# Patient Record
Sex: Male | Born: 1990 | Race: Black or African American | Hispanic: No | Marital: Single | Smoking: Current every day smoker
Health system: Southern US, Community
[De-identification: ages and names within clinical notes are randomized; demographics above are authoritative.]

## PROBLEM LIST (undated history)

## (undated) DIAGNOSIS — S73015A Posterior dislocation of left hip, initial encounter: Secondary | ICD-10-CM

## (undated) DIAGNOSIS — E349 Endocrine disorder, unspecified: Secondary | ICD-10-CM

## (undated) DIAGNOSIS — M238X2 Other internal derangements of left knee: Secondary | ICD-10-CM

## (undated) DIAGNOSIS — M62838 Other muscle spasm: Secondary | ICD-10-CM

## (undated) DIAGNOSIS — M238X1 Other internal derangements of right knee: Secondary | ICD-10-CM

## (undated) DIAGNOSIS — S82002B Unspecified fracture of left patella, initial encounter for open fracture type I or II: Secondary | ICD-10-CM

## (undated) DIAGNOSIS — E611 Iron deficiency: Secondary | ICD-10-CM

## (undated) DIAGNOSIS — S32402A Unspecified fracture of left acetabulum, initial encounter for closed fracture: Secondary | ICD-10-CM

## (undated) DIAGNOSIS — E559 Vitamin D deficiency, unspecified: Secondary | ICD-10-CM

## (undated) DIAGNOSIS — F1910 Other psychoactive substance abuse, uncomplicated: Secondary | ICD-10-CM

## (undated) DIAGNOSIS — S6291XB Unspecified fracture of right wrist and hand, initial encounter for open fracture: Secondary | ICD-10-CM

---

## 2014-04-03 ENCOUNTER — Emergency Department (HOSPITAL_COMMUNITY): Payer: BC Managed Care – PPO

## 2014-04-03 ENCOUNTER — Inpatient Hospital Stay (HOSPITAL_COMMUNITY): Payer: BC Managed Care – PPO

## 2014-04-03 ENCOUNTER — Inpatient Hospital Stay (HOSPITAL_COMMUNITY)
Admission: EM | Admit: 2014-04-03 | Discharge: 2014-04-11 | DRG: 481 | Disposition: A | Discharge status: Home / Self Care | Payer: BC Managed Care – PPO | Attending: Orthopedic Surgery | Admitting: Orthopedic Surgery

## 2014-04-03 ENCOUNTER — Encounter (HOSPITAL_COMMUNITY): Payer: Self-pay | Admitting: Emergency Medicine

## 2014-04-03 DIAGNOSIS — E559 Vitamin D deficiency, unspecified: Secondary | ICD-10-CM | POA: Diagnosis present

## 2014-04-03 DIAGNOSIS — S91009A Unspecified open wound, unspecified ankle, initial encounter: Secondary | ICD-10-CM

## 2014-04-03 DIAGNOSIS — F1010 Alcohol abuse, uncomplicated: Secondary | ICD-10-CM | POA: Diagnosis present

## 2014-04-03 DIAGNOSIS — S73006A Unspecified dislocation of unspecified hip, initial encounter: Secondary | ICD-10-CM | POA: Diagnosis present

## 2014-04-03 DIAGNOSIS — H9210 Otorrhea, unspecified ear: Secondary | ICD-10-CM | POA: Diagnosis present

## 2014-04-03 DIAGNOSIS — F17200 Nicotine dependence, unspecified, uncomplicated: Secondary | ICD-10-CM | POA: Diagnosis present

## 2014-04-03 DIAGNOSIS — D62 Acute posthemorrhagic anemia: Secondary | ICD-10-CM | POA: Diagnosis not present

## 2014-04-03 DIAGNOSIS — M545 Low back pain, unspecified: Secondary | ICD-10-CM | POA: Diagnosis present

## 2014-04-03 DIAGNOSIS — E291 Testicular hypofunction: Secondary | ICD-10-CM | POA: Diagnosis present

## 2014-04-03 DIAGNOSIS — M25469 Effusion, unspecified knee: Secondary | ICD-10-CM | POA: Diagnosis present

## 2014-04-03 DIAGNOSIS — S32409A Unspecified fracture of unspecified acetabulum, initial encounter for closed fracture: Secondary | ICD-10-CM | POA: Diagnosis present

## 2014-04-03 DIAGNOSIS — E611 Iron deficiency: Secondary | ICD-10-CM | POA: Diagnosis present

## 2014-04-03 DIAGNOSIS — F1210 Cannabis abuse, uncomplicated: Secondary | ICD-10-CM | POA: Diagnosis present

## 2014-04-03 DIAGNOSIS — E349 Endocrine disorder, unspecified: Secondary | ICD-10-CM | POA: Diagnosis present

## 2014-04-03 DIAGNOSIS — S02109A Fracture of base of skull, unspecified side, initial encounter for closed fracture: Secondary | ICD-10-CM | POA: Diagnosis present

## 2014-04-03 DIAGNOSIS — M238X2 Other internal derangements of left knee: Secondary | ICD-10-CM | POA: Diagnosis present

## 2014-04-03 DIAGNOSIS — S83106A Unspecified dislocation of unspecified knee, initial encounter: Secondary | ICD-10-CM | POA: Diagnosis present

## 2014-04-03 DIAGNOSIS — S73015A Posterior dislocation of left hip, initial encounter: Secondary | ICD-10-CM | POA: Diagnosis present

## 2014-04-03 DIAGNOSIS — F1910 Other psychoactive substance abuse, uncomplicated: Secondary | ICD-10-CM | POA: Diagnosis present

## 2014-04-03 DIAGNOSIS — M238X1 Other internal derangements of right knee: Secondary | ICD-10-CM | POA: Diagnosis present

## 2014-04-03 DIAGNOSIS — S82002B Unspecified fracture of left patella, initial encounter for open fracture type I or II: Secondary | ICD-10-CM | POA: Diagnosis present

## 2014-04-03 DIAGNOSIS — S62309A Unspecified fracture of unspecified metacarpal bone, initial encounter for closed fracture: Secondary | ICD-10-CM | POA: Diagnosis present

## 2014-04-03 DIAGNOSIS — S6291XB Unspecified fracture of right wrist and hand, initial encounter for open fracture: Secondary | ICD-10-CM | POA: Diagnosis present

## 2014-04-03 DIAGNOSIS — M25069 Hemarthrosis, unspecified knee: Secondary | ICD-10-CM | POA: Diagnosis present

## 2014-04-03 DIAGNOSIS — S72009A Fracture of unspecified part of neck of unspecified femur, initial encounter for closed fracture: Secondary | ICD-10-CM | POA: Diagnosis present

## 2014-04-03 DIAGNOSIS — S0219XA Other fracture of base of skull, initial encounter for closed fracture: Secondary | ICD-10-CM | POA: Diagnosis present

## 2014-04-03 DIAGNOSIS — S81809A Unspecified open wound, unspecified lower leg, initial encounter: Secondary | ICD-10-CM

## 2014-04-03 DIAGNOSIS — S82009A Unspecified fracture of unspecified patella, initial encounter for closed fracture: Secondary | ICD-10-CM | POA: Diagnosis present

## 2014-04-03 DIAGNOSIS — S81009A Unspecified open wound, unspecified knee, initial encounter: Secondary | ICD-10-CM | POA: Diagnosis present

## 2014-04-03 DIAGNOSIS — S32402A Unspecified fracture of left acetabulum, initial encounter for closed fracture: Secondary | ICD-10-CM

## 2014-04-03 HISTORY — DX: Posterior dislocation of left hip, initial encounter: S73.015A

## 2014-04-03 HISTORY — DX: Iron deficiency: E61.1

## 2014-04-03 HISTORY — DX: Unspecified fracture of right wrist and hand, initial encounter for open fracture: S62.91XB

## 2014-04-03 HISTORY — DX: Other internal derangements of left knee: M23.8X2

## 2014-04-03 HISTORY — DX: Unspecified fracture of left acetabulum, initial encounter for closed fracture: S32.402A

## 2014-04-03 HISTORY — DX: Other internal derangements of right knee: M23.8X1

## 2014-04-03 HISTORY — DX: Other muscle spasm: M62.838

## 2014-04-03 HISTORY — DX: Unspecified fracture of left patella, initial encounter for open fracture type I or II: S82.002B

## 2014-04-03 HISTORY — DX: Vitamin D deficiency, unspecified: E55.9

## 2014-04-03 HISTORY — DX: Other psychoactive substance abuse, uncomplicated: F19.10

## 2014-04-03 HISTORY — DX: Endocrine disorder, unspecified: E34.9

## 2014-04-03 LAB — I-STAT CHEM 8, ED
BUN: 13 mg/dL (ref 6–23)
CALCIUM ION: 1.14 mmol/L (ref 1.12–1.23)
Chloride: 111 mEq/L (ref 96–112)
Creatinine, Ser: 1.4 mg/dL — ABNORMAL HIGH (ref 0.50–1.35)
Glucose, Bld: 162 mg/dL — ABNORMAL HIGH (ref 70–99)
HCT: 44 % (ref 39.0–52.0)
Hemoglobin: 15 g/dL (ref 13.0–17.0)
Potassium: 3.4 mEq/L — ABNORMAL LOW (ref 3.7–5.3)
Sodium: 142 mEq/L (ref 137–147)
TCO2: 18 mmol/L (ref 0–100)

## 2014-04-03 LAB — ABO/RH: ABO/RH(D): A POS

## 2014-04-03 LAB — CBC WITH DIFFERENTIAL/PLATELET
BASOS ABS: 0 10*3/uL (ref 0.0–0.1)
Basophils Absolute: 0 10*3/uL (ref 0.0–0.1)
Basophils Relative: 0 % (ref 0–1)
Basophils Relative: 0 % (ref 0–1)
EOS ABS: 0 10*3/uL (ref 0.0–0.7)
EOS PCT: 0 % (ref 0–5)
EOS PCT: 0 % (ref 0–5)
Eosinophils Absolute: 0 10*3/uL (ref 0.0–0.7)
HCT: 39.1 % (ref 39.0–52.0)
HEMATOCRIT: 33.2 % — AB (ref 39.0–52.0)
HEMOGLOBIN: 13.6 g/dL (ref 13.0–17.0)
Hemoglobin: 11.5 g/dL — ABNORMAL LOW (ref 13.0–17.0)
Lymphocytes Relative: 13 % (ref 12–46)
Lymphocytes Relative: 12 % (ref 12–46)
Lymphs Abs: 2 10*3/uL (ref 0.7–4.0)
Lymphs Abs: 1.6 10*3/uL (ref 0.7–4.0)
MCH: 30.4 pg (ref 26.0–34.0)
MCH: 30.2 pg (ref 26.0–34.0)
MCHC: 34.8 g/dL (ref 30.0–36.0)
MCHC: 34.6 g/dL (ref 30.0–36.0)
MCV: 87.5 fL (ref 78.0–100.0)
MCV: 87.1 fL (ref 78.0–100.0)
MONO ABS: 2.3 10*3/uL — AB (ref 0.1–1.0)
MONOS PCT: 5 % (ref 3–12)
Monocytes Absolute: 0.7 10*3/uL (ref 0.1–1.0)
Monocytes Relative: 18 % — ABNORMAL HIGH (ref 3–12)
Neutro Abs: 12.6 10*3/uL — ABNORMAL HIGH (ref 1.7–7.7)
Neutro Abs: 9.1 10*3/uL — ABNORMAL HIGH (ref 1.7–7.7)
Neutrophils Relative %: 82 % — ABNORMAL HIGH (ref 43–77)
Neutrophils Relative %: 70 % (ref 43–77)
PLATELETS: 246 10*3/uL (ref 150–400)
Platelets: 249 10*3/uL (ref 150–400)
RBC: 4.47 MIL/uL (ref 4.22–5.81)
RBC: 3.81 MIL/uL — ABNORMAL LOW (ref 4.22–5.81)
RDW: 12.7 % (ref 11.5–15.5)
RDW: 12.9 % (ref 11.5–15.5)
WBC: 15.4 10*3/uL — ABNORMAL HIGH (ref 4.0–10.5)
WBC: 12.9 10*3/uL — ABNORMAL HIGH (ref 4.0–10.5)

## 2014-04-03 LAB — URINALYSIS, ROUTINE W REFLEX MICROSCOPIC
Bilirubin Urine: NEGATIVE
Bilirubin Urine: NEGATIVE
GLUCOSE, UA: NEGATIVE mg/dL
Glucose, UA: NEGATIVE mg/dL
KETONES UR: NEGATIVE mg/dL
KETONES UR: 15 mg/dL — AB
LEUKOCYTES UA: NEGATIVE
Leukocytes, UA: NEGATIVE
NITRITE: NEGATIVE
Nitrite: NEGATIVE
PROTEIN: 100 mg/dL — AB
PROTEIN: NEGATIVE mg/dL
Specific Gravity, Urine: 1.014 (ref 1.005–1.030)
Specific Gravity, Urine: 1.018 (ref 1.005–1.030)
UROBILINOGEN UA: 0.2 mg/dL (ref 0.0–1.0)
Urobilinogen, UA: 0.2 mg/dL (ref 0.0–1.0)
pH: 6 (ref 5.0–8.0)
pH: 6 (ref 5.0–8.0)

## 2014-04-03 LAB — COMPREHENSIVE METABOLIC PANEL
ALT: 30 U/L (ref 0–53)
ALT: 36 U/L (ref 0–53)
ANION GAP: 20 — AB (ref 5–15)
AST: 56 U/L — ABNORMAL HIGH (ref 0–37)
AST: 95 U/L — AB (ref 0–37)
Albumin: 4.2 g/dL (ref 3.5–5.2)
Albumin: 4 g/dL (ref 3.5–5.2)
Alkaline Phosphatase: 69 U/L (ref 39–117)
Alkaline Phosphatase: 67 U/L (ref 39–117)
Anion gap: 14 (ref 5–15)
BUN: 13 mg/dL (ref 6–23)
BUN: 9 mg/dL (ref 6–23)
CALCIUM: 9.5 mg/dL (ref 8.4–10.5)
CALCIUM: 9.2 mg/dL (ref 8.4–10.5)
CO2: 17 mEq/L — ABNORMAL LOW (ref 19–32)
CO2: 24 mEq/L (ref 19–32)
CREATININE: 1.13 mg/dL (ref 0.50–1.35)
CREATININE: 0.85 mg/dL (ref 0.50–1.35)
Chloride: 105 mEq/L (ref 96–112)
Chloride: 101 mEq/L (ref 96–112)
GFR calc Af Amer: >90 mL/min (ref >90)
GFR calc non Af Amer: 90 mL/min — ABNORMAL LOW (ref >90)
GFR calc non Af Amer: >90 mL/min (ref >90)
Glucose, Bld: 160 mg/dL — ABNORMAL HIGH (ref 70–99)
Glucose, Bld: 97 mg/dL (ref 70–99)
Potassium: 3.7 mEq/L (ref 3.7–5.3)
Potassium: 4 mEq/L (ref 3.7–5.3)
Sodium: 142 mEq/L (ref 137–147)
Sodium: 139 mEq/L (ref 137–147)
TOTAL PROTEIN: 7.7 g/dL (ref 6.0–8.3)
Total Bilirubin: 0.5 mg/dL (ref 0.3–1.2)
Total Bilirubin: 1.7 mg/dL — ABNORMAL HIGH (ref 0.3–1.2)
Total Protein: 7.1 g/dL (ref 6.0–8.3)

## 2014-04-03 LAB — URINE MICROSCOPIC-ADD ON

## 2014-04-03 LAB — ETHANOL: Alcohol, Ethyl (B): 202 mg/dL — ABNORMAL HIGH (ref 0–11)

## 2014-04-03 LAB — RAPID URINE DRUG SCREEN, HOSP PERFORMED
Amphetamines: NOT DETECTED
Barbiturates: NOT DETECTED
Benzodiazepines: NOT DETECTED
Cocaine: NOT DETECTED
OPIATES: NOT DETECTED
Tetrahydrocannabinol: POSITIVE — AB

## 2014-04-03 LAB — APTT: APTT: 32 s (ref 24–37)

## 2014-04-03 LAB — TYPE AND SCREEN
ABO/RH(D): A POS
ANTIBODY SCREEN: NEGATIVE

## 2014-04-03 LAB — I-STAT CG4 LACTIC ACID, ED: LACTIC ACID, VENOUS: 4.48 mmol/L — AB (ref 0.5–2.2)

## 2014-04-03 LAB — PROTIME-INR
INR: 1.23 (ref 0.00–1.49)
Prothrombin Time: 15.5 seconds — ABNORMAL HIGH (ref 11.6–15.2)

## 2014-04-03 LAB — LACTIC ACID, PLASMA: Lactic Acid, Venous: 2.1 mmol/L (ref 0.5–2.2)

## 2014-04-03 MED ORDER — SODIUM CHLORIDE 0.9 % IV BOLUS (SEPSIS)
1000.0000 mL | Freq: Once | INTRAVENOUS | Status: AC
  Administered 2014-04-03: 1000 mL via INTRAVENOUS

## 2014-04-03 MED ORDER — KETAMINE HCL 10 MG/ML IJ SOLN
160.0000 mg | Freq: Once | INTRAMUSCULAR | Status: AC
  Administered 2014-04-03: 90 mg via INTRAVENOUS
  Filled 2014-04-03: qty 16

## 2014-04-03 MED ORDER — FENTANYL CITRATE 0.05 MG/ML IJ SOLN
INTRAMUSCULAR | Status: AC
  Administered 2014-04-03: 100 ug
  Filled 2014-04-03: qty 2

## 2014-04-03 MED ORDER — ONDANSETRON HCL 4 MG PO TABS: 4.0000 mg | ORAL_TABLET | Freq: Three times a day (TID) | ORAL | Status: DC | PRN

## 2014-04-03 MED ORDER — MAGNESIUM CITRATE PO SOLN: 1.0000 | Freq: Once | ORAL | Status: AC | PRN

## 2014-04-03 MED ORDER — KETAMINE HCL 10 MG/ML IJ SOLN
INTRAMUSCULAR | Status: AC | PRN
  Administered 2014-04-03: 50 mg via INTRAVENOUS

## 2014-04-03 MED ORDER — POLYETHYLENE GLYCOL 3350 17 G PO PACK
17.0000 g | PACK | Freq: Every day | ORAL | Status: DC | PRN
  Filled 2014-04-03: qty 1

## 2014-04-03 MED ORDER — MORPHINE SULFATE 2 MG/ML IJ SOLN
0.5000 mg | INTRAMUSCULAR | Status: DC | PRN
  Administered 2014-04-03: 0.5 mg via INTRAVENOUS
  Filled 2014-04-03: qty 1

## 2014-04-03 MED ORDER — SODIUM CHLORIDE 0.9 % IV SOLN
INTRAVENOUS | Status: DC
  Administered 2014-04-03 – 2014-04-10 (×8): via INTRAVENOUS

## 2014-04-03 MED ORDER — ONDANSETRON HCL 4 MG/2ML IJ SOLN
4.0000 mg | Freq: Once | INTRAMUSCULAR | Status: AC
  Administered 2014-04-03: 4 mg via INTRAVENOUS
  Filled 2014-04-03: qty 2

## 2014-04-03 MED ORDER — FENTANYL CITRATE 0.05 MG/ML IJ SOLN
INTRAMUSCULAR | Status: AC
  Administered 2014-04-03: 100 ug
  Filled 2014-04-03: qty 4

## 2014-04-03 MED ORDER — DEXTROSE 5 % IV SOLN
500.0000 mg | Freq: Four times a day (QID) | INTRAVENOUS | Status: DC | PRN
  Filled 2014-04-03: qty 5

## 2014-04-03 MED ORDER — ONDANSETRON HCL 4 MG/2ML IJ SOLN
4.0000 mg | Freq: Four times a day (QID) | INTRAMUSCULAR | Status: DC | PRN
  Administered 2014-04-03: 4 mg via INTRAVENOUS
  Filled 2014-04-03: qty 2

## 2014-04-03 MED ORDER — SODIUM BICARBONATE 4 % IV SOLN
5.0000 mL | Freq: Once | INTRAVENOUS | Status: AC
  Administered 2014-04-03: 5 mL via INTRAVENOUS
  Filled 2014-04-03: qty 5

## 2014-04-03 MED ORDER — HYDROCODONE-ACETAMINOPHEN 5-325 MG PO TABS
1.0000 | ORAL_TABLET | Freq: Four times a day (QID) | ORAL | Status: DC | PRN
  Administered 2014-04-03: 2 via ORAL
  Filled 2014-04-03: qty 2

## 2014-04-03 MED ORDER — METHOCARBAMOL 500 MG PO TABS
500.0000 mg | ORAL_TABLET | Freq: Four times a day (QID) | ORAL | Status: DC | PRN
  Administered 2014-04-03: 500 mg via ORAL
  Filled 2014-04-03: qty 1

## 2014-04-03 MED ORDER — SORBITOL 70 % SOLN
30.0000 mL | Freq: Every day | Status: DC | PRN
  Filled 2014-04-03: qty 30

## 2014-04-03 MED ORDER — OXYCODONE HCL 5 MG PO TABS
5.0000 mg | ORAL_TABLET | ORAL | Status: DC | PRN
  Administered 2014-04-03 – 2014-04-04 (×5): 10 mg via ORAL
  Filled 2014-04-03 (×6): qty 2

## 2014-04-03 MED ORDER — LIDOCAINE-EPINEPHRINE 1 %-1:100000 IJ SOLN
20.0000 mL | Freq: Once | INTRAMUSCULAR | Status: AC
Start: 2014-04-03 — End: 2014-04-03
  Administered 2014-04-03: 20 mL
  Filled 2014-04-03: qty 1

## 2014-04-03 MED ORDER — DIAZEPAM 5 MG PO TABS
5.0000 mg | ORAL_TABLET | Freq: Four times a day (QID) | ORAL | Status: DC | PRN
  Administered 2014-04-03 – 2014-04-10 (×9): 5 mg via ORAL
  Filled 2014-04-03 (×9): qty 1

## 2014-04-03 MED ORDER — FENTANYL CITRATE 0.05 MG/ML IJ SOLN
100.0000 ug | Freq: Once | INTRAMUSCULAR | Status: AC
  Administered 2014-04-03: 100 ug via INTRAVENOUS

## 2014-04-03 MED ORDER — TETANUS-DIPHTH-ACELL PERTUSSIS 5-2.5-18.5 LF-MCG/0.5 IM SUSP
0.5000 mL | Freq: Once | INTRAMUSCULAR | Status: AC
  Administered 2014-04-03: 0.5 mL via INTRAMUSCULAR

## 2014-04-03 MED ORDER — SENNA 8.6 MG PO TABS
1.0000 | ORAL_TABLET | Freq: Two times a day (BID) | ORAL | Status: DC
  Administered 2014-04-03 – 2014-04-11 (×14): 8.6 mg via ORAL
  Filled 2014-04-03 (×19): qty 1

## 2014-04-03 MED ORDER — CIPROFLOXACIN-DEXAMETHASONE 0.3-0.1 % OT SUSP
4.0000 [drp] | Freq: Two times a day (BID) | OTIC | Status: DC
Start: 2014-04-03 — End: 2014-04-11
  Administered 2014-04-03 – 2014-04-11 (×17): 4 [drp] via OTIC
  Filled 2014-04-03 (×3): qty 7.5

## 2014-04-03 MED ORDER — CEFAZOLIN SODIUM-DEXTROSE 2-3 GM-% IV SOLR
INTRAVENOUS | Status: AC
  Administered 2014-04-03: 2000 mg
  Filled 2014-04-03: qty 50

## 2014-04-03 MED ORDER — TETANUS-DIPHTH-ACELL PERTUSSIS 5-2.5-18.5 LF-MCG/0.5 IM SUSP
INTRAMUSCULAR | Status: AC
  Filled 2014-04-03: qty 0.5

## 2014-04-03 MED ORDER — ONDANSETRON HCL 4 MG/2ML IJ SOLN
4.0000 mg | Freq: Once | INTRAMUSCULAR | Status: AC
  Administered 2014-04-03: 4 mg via INTRAVENOUS

## 2014-04-03 MED ORDER — HYDROMORPHONE HCL PF 1 MG/ML IJ SOLN
1.0000 mg | Freq: Once | INTRAMUSCULAR | Status: AC
  Administered 2014-04-03: 1 mg via INTRAVENOUS
  Filled 2014-04-03: qty 1

## 2014-04-03 MED ORDER — MORPHINE SULFATE 2 MG/ML IJ SOLN
2.0000 mg | INTRAMUSCULAR | Status: DC | PRN
  Administered 2014-04-03 – 2014-04-04 (×5): 2 mg via INTRAVENOUS
  Filled 2014-04-03 (×5): qty 1

## 2014-04-03 NOTE — Progress Notes (Signed)
Chaplain responded to page. Patient was unavailable. Chaplain communicated with staff. Staff will inform chaplain if family needs her services.   04/03/14 0300  Clinical Encounter Type  Visited With Patient not available  Visit Type Critical Care;Initial  Referral From Nurse  Charmian Muff, Chaplain 3:55 AM 04/03/2014

## 2014-04-03 NOTE — H&P (Signed)
See consult note

## 2014-04-03 NOTE — Progress Notes (Signed)
Dr. Carola Frost has agreed to take over care.
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
## 2014-04-03 NOTE — Progress Notes (Signed)
Orthopedic Tech Progress Note Patient Details:  Curtis [name] October 16, 1990 [id number]  Ortho Devices Type of Ortho Device: Knee Immobilizer Ortho Device/Splint Location: LLE Ortho Device/Splint Interventions: Application   Asia R Thompson 04/03/2014, 9:05 AM
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
## 2014-04-03 NOTE — ED Notes (Signed)
Pt transported to CT/XR 

## 2014-04-03 NOTE — Progress Notes (Signed)
Orthopedic Tech Progress Note Patient Details:  Curtis [name] [birth year] [id number]  Musculoskeletal Traction Type of Traction: Skeletal (Balanced Suspension) Traction Location: LLE Traction Weight: 20 lbs    [place] 04/03/2014, 9:05 AM

## 2014-04-03 NOTE — Progress Notes (Signed)
Orthopedic Tech Progress Note Patient Details:  Curtis [name] 02/03/1991 [id number] OHF applied to bed Patient ID: Curtis [name], male   DOB: March 23, 1991, 23 y.o.   MRN: [id number]   Curtis [name] 04/03/2014, 12:38 PM
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
## 2014-04-03 NOTE — ED Notes (Signed)
Pt returned from CT/XR. Family at bedside
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
## 2014-04-03 NOTE — Progress Notes (Signed)
Patient refused foley catheter MD aware

## 2014-04-04 ENCOUNTER — Inpatient Hospital Stay (HOSPITAL_COMMUNITY): Payer: BC Managed Care – PPO

## 2014-04-04 ENCOUNTER — Encounter (HOSPITAL_COMMUNITY): Payer: BC Managed Care – PPO | Admitting: Anesthesiology

## 2014-04-04 ENCOUNTER — Encounter (HOSPITAL_COMMUNITY)
Admission: EM | Disposition: A | Discharge status: Home / Self Care | Payer: Self-pay | Source: Home / Self Care | Attending: Orthopedic Surgery

## 2014-04-04 ENCOUNTER — Encounter (HOSPITAL_COMMUNITY): Payer: Self-pay | Admitting: Orthopedic Surgery

## 2014-04-04 ENCOUNTER — Inpatient Hospital Stay (HOSPITAL_COMMUNITY): Payer: BC Managed Care – PPO | Admitting: Anesthesiology

## 2014-04-04 HISTORY — PX: ORIF ACETABULAR FRACTURE: SHX5029

## 2014-04-04 HISTORY — PX: ORIF PATELLA: SHX5033

## 2014-04-04 LAB — CBC
HCT: 37.7 % — ABNORMAL LOW (ref 39.0–52.0)
Hemoglobin: 12.8 g/dL — ABNORMAL LOW (ref 13.0–17.0)
MCH: 30 pg (ref 26.0–34.0)
MCHC: 34 g/dL (ref 30.0–36.0)
MCV: 88.3 fL (ref 78.0–100.0)
Platelets: 202 10*3/uL (ref 150–400)
RBC: 4.27 MIL/uL (ref 4.22–5.81)
RDW: 12.9 % (ref 11.5–15.5)
WBC: 13.7 10*3/uL — ABNORMAL HIGH (ref 4.0–10.5)

## 2014-04-04 LAB — BASIC METABOLIC PANEL
Anion gap: 12 (ref 5–15)
BUN: 7 mg/dL (ref 6–23)
CALCIUM: 9.4 mg/dL (ref 8.4–10.5)
CO2: 25 mEq/L (ref 19–32)
Chloride: 101 mEq/L (ref 96–112)
Creatinine, Ser: 0.88 mg/dL (ref 0.50–1.35)
Glucose, Bld: 96 mg/dL (ref 70–99)
POTASSIUM: 4.3 meq/L (ref 3.7–5.3)
Sodium: 138 mEq/L (ref 137–147)

## 2014-04-04 LAB — VITAMIN D 25 HYDROXY (VIT D DEFICIENCY, FRACTURES): VIT D 25 HYDROXY: 17 ng/mL — AB (ref 30–89)

## 2014-04-04 LAB — SURGICAL PCR SCREEN
MRSA, PCR: NEGATIVE
Staphylococcus aureus: NEGATIVE

## 2014-04-04 LAB — LACTIC ACID, PLASMA: Lactic Acid, Venous: 0.9 mmol/L (ref 0.5–2.2)

## 2014-04-04 SURGERY — OPEN REDUCTION INTERNAL FIXATION (ORIF) ACETABULAR FRACTURE
Anesthesia: General | Site: Knee | Laterality: Left

## 2014-04-04 MED ORDER — CEFAZOLIN SODIUM-DEXTROSE 2-3 GM-% IV SOLR
2.0000 g | Freq: Once | INTRAVENOUS | Status: AC
  Administered 2014-04-04: 2 g via INTRAVENOUS
  Filled 2014-04-04: qty 50

## 2014-04-04 MED ORDER — ONDANSETRON HCL 4 MG/2ML IJ SOLN
INTRAMUSCULAR | Status: DC | PRN
  Administered 2014-04-04: 4 mg via INTRAVENOUS

## 2014-04-04 MED ORDER — METHOCARBAMOL 1000 MG/10ML IJ SOLN
500.0000 mg | Freq: Four times a day (QID) | INTRAMUSCULAR | Status: DC | PRN
  Filled 2014-04-04: qty 10

## 2014-04-04 MED ORDER — LIDOCAINE HCL (CARDIAC) 20 MG/ML IV SOLN
INTRAVENOUS | Status: DC | PRN
  Administered 2014-04-04: 50 mg via INTRAVENOUS

## 2014-04-04 MED ORDER — ONDANSETRON HCL 4 MG PO TABS: 4.0000 mg | ORAL_TABLET | Freq: Four times a day (QID) | ORAL | Status: DC | PRN

## 2014-04-04 MED ORDER — METHOCARBAMOL 500 MG PO TABS
500.0000 mg | ORAL_TABLET | Freq: Four times a day (QID) | ORAL | Status: DC | PRN
  Administered 2014-04-04: 500 mg via ORAL
  Filled 2014-04-04 (×2): qty 1

## 2014-04-04 MED ORDER — HYDROMORPHONE HCL PF 1 MG/ML IJ SOLN
0.5000 mg | INTRAMUSCULAR | Status: DC | PRN
  Administered 2014-04-05 – 2014-04-07 (×4): 1 mg via INTRAVENOUS
  Filled 2014-04-04 (×2): qty 1

## 2014-04-04 MED ORDER — LACTATED RINGERS IV SOLN
INTRAVENOUS | Status: DC | PRN
  Administered 2014-04-04 (×3): via INTRAVENOUS

## 2014-04-04 MED ORDER — PROPOFOL 10 MG/ML IV BOLUS
INTRAVENOUS | Status: AC
  Filled 2014-04-04: qty 20

## 2014-04-04 MED ORDER — DIPHENHYDRAMINE HCL 12.5 MG/5ML PO ELIX: 12.5000 mg | ORAL_SOLUTION | Freq: Four times a day (QID) | ORAL | Status: DC | PRN

## 2014-04-04 MED ORDER — SODIUM CHLORIDE 0.9 % IJ SOLN: 9.0000 mL | INTRAMUSCULAR | Status: DC | PRN

## 2014-04-04 MED ORDER — 0.9 % SODIUM CHLORIDE (POUR BTL) OPTIME
TOPICAL | Status: DC | PRN
  Administered 2014-04-04: 1000 mL

## 2014-04-04 MED ORDER — METHOCARBAMOL 500 MG PO TABS
500.0000 mg | ORAL_TABLET | Freq: Four times a day (QID) | ORAL | Status: DC | PRN
  Administered 2014-04-06: 500 mg via ORAL
  Administered 2014-04-07: 1000 mg via ORAL
  Filled 2014-04-04: qty 2

## 2014-04-04 MED ORDER — CEFAZOLIN SODIUM-DEXTROSE 2-3 GM-% IV SOLR
INTRAVENOUS | Status: AC
  Filled 2014-04-04: qty 50

## 2014-04-04 MED ORDER — METOCLOPRAMIDE HCL 5 MG/ML IJ SOLN: 5.0000 mg | Freq: Three times a day (TID) | INTRAMUSCULAR | Status: DC | PRN

## 2014-04-04 MED ORDER — SUFENTANIL CITRATE 50 MCG/ML IV SOLN
INTRAVENOUS | Status: AC
  Filled 2014-04-04: qty 1

## 2014-04-04 MED ORDER — ROCURONIUM BROMIDE 100 MG/10ML IV SOLN
INTRAVENOUS | Status: DC | PRN
  Administered 2014-04-04: 50 mg via INTRAVENOUS
  Administered 2014-04-04: 10 mg via INTRAVENOUS
  Administered 2014-04-04: 20 mg via INTRAVENOUS

## 2014-04-04 MED ORDER — METOCLOPRAMIDE HCL 10 MG PO TABS
5.0000 mg | ORAL_TABLET | Freq: Three times a day (TID) | ORAL | Status: DC | PRN
  Administered 2014-04-10: 10 mg via ORAL
  Filled 2014-04-04: qty 1

## 2014-04-04 MED ORDER — MIDAZOLAM HCL 2 MG/2ML IJ SOLN
INTRAMUSCULAR | Status: AC
  Filled 2014-04-04: qty 2

## 2014-04-04 MED ORDER — WARFARIN SODIUM 10 MG PO TABS
10.0000 mg | ORAL_TABLET | Freq: Once | ORAL | Status: AC
  Administered 2014-04-04: 10 mg via ORAL
  Filled 2014-04-04: qty 1

## 2014-04-04 MED ORDER — OXYCODONE HCL 5 MG PO TABS: 5.0000 mg | ORAL_TABLET | ORAL | Status: DC | PRN

## 2014-04-04 MED ORDER — PHENYLEPHRINE HCL 10 MG/ML IJ SOLN
INTRAMUSCULAR | Status: DC | PRN
  Administered 2014-04-04: 40 ug via INTRAVENOUS

## 2014-04-04 MED ORDER — MAGNESIUM CITRATE PO SOLN: 1.0000 | Freq: Once | ORAL | Status: AC | PRN

## 2014-04-04 MED ORDER — PROPOFOL 10 MG/ML IV BOLUS
INTRAVENOUS | Status: DC | PRN
  Administered 2014-04-04: 200 mg via INTRAVENOUS

## 2014-04-04 MED ORDER — DIPHENHYDRAMINE HCL 50 MG/ML IJ SOLN
12.5000 mg | Freq: Four times a day (QID) | INTRAMUSCULAR | Status: DC | PRN
  Administered 2014-04-05: 12.5 mg via INTRAVENOUS
  Filled 2014-04-04: qty 1

## 2014-04-04 MED ORDER — ROCURONIUM BROMIDE 100 MG/10ML IV SOLN: INTRAVENOUS | Status: DC | PRN

## 2014-04-04 MED ORDER — NEOSTIGMINE METHYLSULFATE 10 MG/10ML IV SOLN
INTRAVENOUS | Status: DC | PRN
  Administered 2014-04-04: 3 mg via INTRAVENOUS

## 2014-04-04 MED ORDER — ONDANSETRON HCL 4 MG/2ML IJ SOLN
INTRAMUSCULAR | Status: AC
  Filled 2014-04-04: qty 2

## 2014-04-04 MED ORDER — ROCURONIUM BROMIDE 50 MG/5ML IV SOLN
INTRAVENOUS | Status: AC
  Filled 2014-04-04: qty 1

## 2014-04-04 MED ORDER — ENOXAPARIN SODIUM 40 MG/0.4ML ~~LOC~~ SOLN
40.0000 mg | SUBCUTANEOUS | Status: DC
  Administered 2014-04-05 – 2014-04-08 (×3): 40 mg via SUBCUTANEOUS
  Filled 2014-04-04 (×6): qty 0.4

## 2014-04-04 MED ORDER — ONDANSETRON HCL 4 MG/2ML IJ SOLN: 4.0000 mg | Freq: Four times a day (QID) | INTRAMUSCULAR | Status: DC | PRN

## 2014-04-04 MED ORDER — ACETAMINOPHEN 10 MG/ML IV SOLN
1000.0000 mg | Freq: Four times a day (QID) | INTRAVENOUS | Status: DC
  Administered 2014-04-04: 1000 mg via INTRAVENOUS
  Filled 2014-04-04: qty 100

## 2014-04-04 MED ORDER — LIDOCAINE HCL (CARDIAC) 20 MG/ML IV SOLN: INTRAVENOUS | Status: DC | PRN

## 2014-04-04 MED ORDER — NEOSTIGMINE METHYLSULFATE 10 MG/10ML IV SOLN
INTRAVENOUS | Status: AC
  Filled 2014-04-04: qty 1

## 2014-04-04 MED ORDER — HYDROMORPHONE 0.3 MG/ML IV SOLN
INTRAVENOUS | Status: AC
  Administered 2014-04-04: 19:00:00 via INTRAVENOUS
  Administered 2014-04-05: 1.8 mg via INTRAVENOUS
  Administered 2014-04-05: 3.3 mg via INTRAVENOUS
  Administered 2014-04-05 (×2): via INTRAVENOUS
  Administered 2014-04-05: 2.1 mg via INTRAVENOUS
  Administered 2014-04-06: 04:00:00 via INTRAVENOUS
  Administered 2014-04-06: 2.1 mg via INTRAVENOUS
  Administered 2014-04-06: 2.7 mg via INTRAVENOUS
  Filled 2014-04-04 (×4): qty 25

## 2014-04-04 MED ORDER — DIPHENHYDRAMINE HCL 12.5 MG/5ML PO ELIX
12.5000 mg | ORAL_SOLUTION | ORAL | Status: DC | PRN
  Administered 2014-04-06 – 2014-04-11 (×11): 25 mg via ORAL
  Filled 2014-04-04 (×10): qty 10

## 2014-04-04 MED ORDER — NALOXONE HCL 0.4 MG/ML IJ SOLN: 0.4000 mg | INTRAMUSCULAR | Status: DC | PRN

## 2014-04-04 MED ORDER — CEFAZOLIN SODIUM 1-5 GM-% IV SOLN
1.0000 g | Freq: Four times a day (QID) | INTRAVENOUS | Status: AC
  Administered 2014-04-04 – 2014-04-05 (×3): 1 g via INTRAVENOUS
  Filled 2014-04-04 (×3): qty 50

## 2014-04-04 MED ORDER — CEFAZOLIN SODIUM-DEXTROSE 2-3 GM-% IV SOLR
INTRAVENOUS | Status: DC | PRN
  Administered 2014-04-04 (×2): 2 g via INTRAVENOUS

## 2014-04-04 MED ORDER — HYDROMORPHONE HCL PF 1 MG/ML IJ SOLN
0.5000 mg | INTRAMUSCULAR | Status: DC | PRN
  Administered 2014-04-04 (×2): 1 mg via INTRAVENOUS
  Filled 2014-04-04 (×6): qty 1

## 2014-04-04 MED ORDER — SUFENTANIL CITRATE 50 MCG/ML IV SOLN
INTRAVENOUS | Status: DC | PRN
  Administered 2014-04-04: 10 ug via INTRAVENOUS
  Administered 2014-04-04: 20 ug via INTRAVENOUS

## 2014-04-04 MED ORDER — GLYCOPYRROLATE 0.2 MG/ML IJ SOLN
INTRAMUSCULAR | Status: AC
  Filled 2014-04-04: qty 2

## 2014-04-04 MED ORDER — WARFARIN VIDEO: Freq: Once | Status: DC

## 2014-04-04 MED ORDER — METHOCARBAMOL 1000 MG/10ML IJ SOLN: 500.0000 mg | Freq: Four times a day (QID) | INTRAMUSCULAR | Status: DC | PRN

## 2014-04-04 MED ORDER — GLYCOPYRROLATE 0.2 MG/ML IJ SOLN
INTRAMUSCULAR | Status: DC | PRN
Start: 2014-04-04 — End: 2014-04-04
  Administered 2014-04-04: 0.6 mg via INTRAVENOUS

## 2014-04-04 MED ORDER — PROPOFOL 10 MG/ML IV BOLUS: INTRAVENOUS | Status: DC | PRN

## 2014-04-04 MED ORDER — SODIUM CHLORIDE 0.9 % IR SOLN
Status: DC | PRN
  Administered 2014-04-04: 6000 mL

## 2014-04-04 MED ORDER — DOCUSATE SODIUM 100 MG PO CAPS
100.0000 mg | ORAL_CAPSULE | Freq: Two times a day (BID) | ORAL | Status: DC
  Administered 2014-04-04 – 2014-04-11 (×14): 100 mg via ORAL
  Filled 2014-04-04 (×19): qty 1

## 2014-04-04 MED ORDER — LACTATED RINGERS IV SOLN
INTRAVENOUS | Status: DC
  Administered 2014-04-04: 13:00:00 via INTRAVENOUS

## 2014-04-04 MED ORDER — PATIENT'S GUIDE TO USING COUMADIN BOOK
Freq: Once | Status: AC
  Administered 2014-04-04: 23:00:00
  Filled 2014-04-04: qty 1

## 2014-04-04 MED ORDER — MIDAZOLAM HCL 5 MG/5ML IJ SOLN
INTRAMUSCULAR | Status: DC | PRN
  Administered 2014-04-04: 2 mg via INTRAVENOUS

## 2014-04-04 MED ORDER — POLYETHYLENE GLYCOL 3350 17 G PO PACK
17.0000 g | PACK | Freq: Every day | ORAL | Status: DC
  Administered 2014-04-05 – 2014-04-11 (×6): 17 g via ORAL
  Filled 2014-04-04 (×9): qty 1

## 2014-04-04 MED ORDER — WARFARIN - PHARMACIST DOSING INPATIENT: Freq: Every day | Status: DC

## 2014-04-04 MED ORDER — HYDROMORPHONE 0.3 MG/ML IV SOLN
INTRAVENOUS | Status: AC
  Filled 2014-04-04: qty 25

## 2014-04-04 SURGICAL SUPPLY — 84 items
ANCHOR CORKSCREW BIO 5.5 FT (Anchor) ×4 IMPLANT
APPLIER CLIP 11 MED OPEN (CLIP)
BIT DRILL AO MATTA 2.5MX230M (BIT) ×2 IMPLANT
BIT DRILL AO MATTA 3.2MX180M (BIT) ×2 IMPLANT
BLADE SURG ROTATE 9660 (MISCELLANEOUS) IMPLANT
BRUSH SCRUB DISP (MISCELLANEOUS) ×8 IMPLANT
CLIP APPLIE 11 MED OPEN (CLIP) IMPLANT
CLOSURE WOUND 1/2 X4 (GAUZE/BANDAGES/DRESSINGS)
COVER SURGICAL LIGHT HANDLE (MISCELLANEOUS) ×4 IMPLANT
DRAIN CHANNEL 10F 3/8 F FF (DRAIN) IMPLANT
DRAIN CHANNEL 15F RND FF W/TCR (WOUND CARE) IMPLANT
DRAPE C-ARM 42X72 X-RAY (DRAPES) IMPLANT
DRAPE C-ARMOR (DRAPES) ×4 IMPLANT
DRAPE INCISE IOBAN 66X45 STRL (DRAPES) IMPLANT
DRAPE INCISE IOBAN 85X60 (DRAPES) ×8 IMPLANT
DRAPE ORTHO SPLIT 77X108 STRL (DRAPES) ×4
DRAPE SURG ORHT 6 SPLT 77X108 (DRAPES) ×4 IMPLANT
DRAPE U-SHAPE 47X51 STRL (DRAPES) ×4 IMPLANT
DRILL BIT AO MATTA 2.5MX230M (BIT) ×4
DRILL BIT AO MATTA 3.2MX180M (BIT) ×4
DRSG ADAPTIC 3X8 NADH LF (GAUZE/BANDAGES/DRESSINGS) IMPLANT
DRSG PAD ABDOMINAL 8X10 ST (GAUZE/BANDAGES/DRESSINGS) ×8 IMPLANT
ELECT BLADE 4.0 EZ CLEAN MEGAD (MISCELLANEOUS) ×4
ELECT BLADE 6.5 EXT (BLADE) IMPLANT
ELECT REM PT RETURN 9FT ADLT (ELECTROSURGICAL) ×4
ELECTRODE BLDE 4.0 EZ CLN MEGD (MISCELLANEOUS) ×2 IMPLANT
ELECTRODE REM PT RTRN 9FT ADLT (ELECTROSURGICAL) ×2 IMPLANT
EVACUATOR 1/8 PVC DRAIN (DRAIN) IMPLANT
EVACUATOR SILICONE 100CC (DRAIN) ×4 IMPLANT
GAUZE SPONGE 4X4 12PLY STRL (GAUZE/BANDAGES/DRESSINGS) ×4 IMPLANT
GAUZE SPONGE 4X4 16PLY XRAY LF (GAUZE/BANDAGES/DRESSINGS) ×4 IMPLANT
GLOVE BIO SURGEON STRL SZ7.5 (GLOVE) ×4 IMPLANT
GLOVE BIO SURGEON STRL SZ8 (GLOVE) ×4 IMPLANT
GLOVE BIOGEL PI IND STRL 7.5 (GLOVE) ×2 IMPLANT
GLOVE BIOGEL PI IND STRL 8 (GLOVE) ×2 IMPLANT
GLOVE BIOGEL PI INDICATOR 7.5 (GLOVE) ×2
GLOVE BIOGEL PI INDICATOR 8 (GLOVE) ×2
GOWN STRL REUS W/ TWL LRG LVL3 (GOWN DISPOSABLE) ×4 IMPLANT
GOWN STRL REUS W/ TWL XL LVL3 (GOWN DISPOSABLE) ×2 IMPLANT
GOWN STRL REUS W/TWL 2XL LVL3 (GOWN DISPOSABLE) ×4 IMPLANT
GOWN STRL REUS W/TWL LRG LVL3 (GOWN DISPOSABLE) ×4
GOWN STRL REUS W/TWL XL LVL3 (GOWN DISPOSABLE) ×2
HANDPIECE INTERPULSE COAX TIP (DISPOSABLE)
KIT BASIN OR (CUSTOM PROCEDURE TRAY) ×4 IMPLANT
KIT ROOM TURNOVER OR (KITS) ×4 IMPLANT
LIGHT ORTHO (MISCELLANEOUS) IMPLANT
LOOP VESSEL MAXI BLUE (MISCELLANEOUS) IMPLANT
MANIFOLD NEPTUNE II (INSTRUMENTS) ×4 IMPLANT
NDL SUT 6 .5 CRC .975X.05 MAYO (NEEDLE) ×2 IMPLANT
NEEDLE MAYO TAPER (NEEDLE) ×2
NEEDLE MAYO TROCAR (NEEDLE) ×4 IMPLANT
NS IRRIG 1000ML POUR BTL (IV SOLUTION) ×4 IMPLANT
PACK TOTAL JOINT (CUSTOM PROCEDURE TRAY) ×4 IMPLANT
PAD ARMBOARD 7.5X6 YLW CONV (MISCELLANEOUS) ×8 IMPLANT
PIN 6.0X180MM REDUCTION (PIN) ×4 IMPLANT
PLATE ACET STRT 94.5M 8H (Plate) ×4 IMPLANT
PLATE SPRING 3.5MM 3H STRL (Plate) ×4 IMPLANT
RETRIEVER SUT HEWSON (MISCELLANEOUS) ×4 IMPLANT
SCREW CORT 48X3.5XST NS LF (Screw) ×4 IMPLANT
SCREW CORTEX ST MATTA 3.5X32MM (Screw) ×8 IMPLANT
SCREW CORTEX ST MATTA 3.5X40MM (Screw) ×4 IMPLANT
SCREW CORTEX ST MATTA 3.5X45MM (Screw) ×4 IMPLANT
SCREW CORTICAL 3.5X48MM (Screw) ×4 IMPLANT
SET HNDPC FAN SPRY TIP SCT (DISPOSABLE) IMPLANT
SPONGE LAP 18X18 X RAY DECT (DISPOSABLE) ×12 IMPLANT
STAPLER VISISTAT 35W (STAPLE) ×4 IMPLANT
STRIP CLOSURE SKIN 1/2X4 (GAUZE/BANDAGES/DRESSINGS) IMPLANT
SUCTION FRAZIER TIP 10 FR DISP (SUCTIONS) ×4 IMPLANT
SUT ETHILON 3 0 PS 1 (SUTURE) ×12 IMPLANT
SUT FIBERWIRE #2 38 T-5 BLUE (SUTURE)
SUT PDS AB 0 CT 36 (SUTURE) ×4 IMPLANT
SUT PDS AB 2-0 CT1 27 (SUTURE) ×4 IMPLANT
SUT VIC AB 0 CT1 27 (SUTURE) ×2
SUT VIC AB 0 CT1 27XBRD ANBCTR (SUTURE) ×2 IMPLANT
SUT VIC AB 1 CT1 18XCR BRD 8 (SUTURE) ×2 IMPLANT
SUT VIC AB 1 CT1 8-18 (SUTURE) ×2
SUT VIC AB 2-0 CT1 27 (SUTURE) ×2
SUT VIC AB 2-0 CT1 TAPERPNT 27 (SUTURE) ×2 IMPLANT
SUTURE FIBERWR #2 38 T-5 BLUE (SUTURE) IMPLANT
TIP HIGH FLOW IRRIGATION COAX (MISCELLANEOUS) ×4 IMPLANT
TOWEL OR 17X24 6PK STRL BLUE (TOWEL DISPOSABLE) IMPLANT
TOWEL OR 17X26 10 PK STRL BLUE (TOWEL DISPOSABLE) ×8 IMPLANT
TRAY FOLEY CATH 16FRSI W/METER (SET/KITS/TRAYS/PACK) IMPLANT
WATER STERILE IRR 1000ML POUR (IV SOLUTION) IMPLANT

## 2014-04-04 NOTE — Anesthesia Preprocedure Evaluation (Addendum)
Anesthesia Evaluation  Patient identified by MRN, date of birth, ID band Patient awake    Reviewed: Allergy & Precautions, H&P , NPO status , Patient's Chart, lab work & pertinent test results  Airway       Dental   Pulmonary Current Smoker,          Cardiovascular     Neuro/Psych    GI/Hepatic   Endo/Other    Renal/GU      Musculoskeletal   Abdominal   Peds  Hematology   Anesthesia Other Findings   Reproductive/Obstetrics                          Anesthesia Physical Anesthesia Plan  ASA: II  Anesthesia Plan:    Post-op Pain Management:    Induction:   Airway Management Planned:   Additional Equipment:   Intra-op Plan:   Post-operative Plan:   Informed Consent:   Plan Discussed with:   Anesthesia Plan Comments:         Anesthesia Quick Evaluation
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
## 2014-04-04 NOTE — Brief Op Note (Signed)
04/03/2014 - 04/04/2014  5:45 PM  PATIENT:  Curtis [name]  23 y.o. male  PRE-OPERATIVE DIAGNOSIS:  1. Irreducible left posterior wall acetabular fracture dislocation 2. Left knee wound 3. Right knee effusion  POST-OPERATIVE DIAGNOSIS:  1. Irreducible left posterior wall acetabular fracture dislocation 2. Left open patella fracture 3. Right PCL and probable PLC  PROCEDURE:  Procedure(s) with comments: 1. OPEN REDUCTION INTERNAL FIXATION (ORIF) ACETABULAR FRACTURE (Left) POSTERIOR WALL 2. OPEN TREATMENT OF HIP DISLOCATION WITH LABRAL AND CAPSULAR REPAIR 3. IRRIGATION AND DEBRIDEMENT OF OPEN PATELLA FRACTURE (Left) 4. EXAMINATION UNDER ANESTHESIA OF RIGHT KNEE 5. REMOVAL OF PERCUTANEOUS TRACTION PIN DISTAL FEMUR  SURGEON:  Surgeon(s) and Role:    * Budd Palmer, MD - Primary  PHYSICIAN ASSISTANT: Montez Morita, PA-C  ANESTHESIA:   general  I/O:  Total I/O In: 2000 [I.V.:2000] Out: 1250 [Urine:1150; Blood:100]  SPECIMEN:  No Specimen  TOURNIQUET:  * No tourniquets in log *  DICTATION: .Other Dictation: Dictation Number [id number]
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
## 2014-04-04 NOTE — Transfer of Care (Signed)
Immediate Anesthesia Transfer of Care Note  Patient: Curtis [name]  Procedure(s) Performed: Procedure(s) with comments: OPEN REDUCTION INTERNAL FIXATION (ORIF) ACETABULAR FRACTURE (Left) OPEN REDUCTION INTERNAL (ORIF) FIXATION PATELLA (Left) - dirty wound   Patient Location: PACU  Anesthesia Type:General  Level of Consciousness: awake and alert   Airway & Oxygen Therapy: Patient Spontanous Breathing and Patient connected to nasal cannula oxygen  Post-op Assessment: Report given to PACU RN and Post -op Vital signs reviewed and stable  Post vital signs: Reviewed and stable  Complications: No apparent anesthesia complications
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
## 2014-04-04 NOTE — Consult Note (Signed)
I have seen and examined the patient. I agree with the findings above.  RLE large effusion  LLE reports decreased sensation but intact motor   DP 2+  I discussed with the patient the risks and benefits of surgery for left acetabular fracture, including the possibility of infection, nerve injury, vessel injury, wound breakdown, arthritis, symptomatic hardware, DVT/ PE, avascular necrosis, loss of motion, heterotopic bone, and need for further surgery among others.  He understood these risks and wished to proceed.   Budd Palmer, MD 04/04/2014 1:32 PM
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
## 2014-04-04 NOTE — Progress Notes (Signed)
Pt unable to tolerate moving to place sacral dressing.  Pt is in traction and unable to move due to pain.

## 2014-04-04 NOTE — Anesthesia Procedure Notes (Signed)
Procedure Name: Intubation Date/Time: 04/04/2014 1:48 PM Performed by: Gwenyth Allegra Pre-anesthesia Checklist: Patient identified, Timeout performed, Emergency Drugs available, Suction available and Patient being monitored Patient Re-evaluated:Patient Re-evaluated prior to inductionOxygen Delivery Method: Circle system utilized Preoxygenation: Pre-oxygenation with 100% oxygen Intubation Type: IV induction Ventilation: Mask ventilation without difficulty Laryngoscope Size: Mac and 4 Grade View: Grade I Tube size: 7.5 mm Number of attempts: 1 Placement Confirmation: ETT inserted through vocal cords under direct vision,  breath sounds checked- equal and bilateral and positive ETCO2 Secured at: 22 cm Tube secured with: Tape Dental Injury: Teeth and Oropharynx as per pre-operative assessment

## 2014-04-05 ENCOUNTER — Inpatient Hospital Stay (HOSPITAL_COMMUNITY): Payer: BC Managed Care – PPO

## 2014-04-05 LAB — CBC
HEMATOCRIT: 32.4 % — AB (ref 39.0–52.0)
Hemoglobin: 11.1 g/dL — ABNORMAL LOW (ref 13.0–17.0)
MCH: 30 pg (ref 26.0–34.0)
MCHC: 34.3 g/dL (ref 30.0–36.0)
MCV: 87.6 fL (ref 78.0–100.0)
PLATELETS: 198 10*3/uL (ref 150–400)
RBC: 3.7 MIL/uL — ABNORMAL LOW (ref 4.22–5.81)
RDW: 12.5 % (ref 11.5–15.5)
WBC: 14.8 10*3/uL — ABNORMAL HIGH (ref 4.0–10.5)

## 2014-04-05 LAB — BASIC METABOLIC PANEL
ANION GAP: 12 (ref 5–15)
BUN: 3 mg/dL — ABNORMAL LOW (ref 6–23)
CHLORIDE: 99 meq/L (ref 96–112)
CO2: 25 mEq/L (ref 19–32)
CREATININE: 0.82 mg/dL (ref 0.50–1.35)
Calcium: 8.8 mg/dL (ref 8.4–10.5)
GFR calc Af Amer: >90 mL/min (ref >90)
Glucose, Bld: 136 mg/dL — ABNORMAL HIGH (ref 70–99)
Potassium: 3.8 mEq/L (ref 3.7–5.3)
Sodium: 136 mEq/L — ABNORMAL LOW (ref 137–147)

## 2014-04-05 LAB — HEPATITIS PANEL, ACUTE
HCV Ab: NEGATIVE
HEP A IGM: NONREACTIVE
Hep B C IgM: NONREACTIVE
Hepatitis B Surface Ag: NEGATIVE

## 2014-04-05 LAB — PROTIME-INR
INR: 1.22 (ref 0.00–1.49)
Prothrombin Time: 15.4 seconds — ABNORMAL HIGH (ref 11.6–15.2)

## 2014-04-05 LAB — VITAMIN D 25 HYDROXY (VIT D DEFICIENCY, FRACTURES): VIT D 25 HYDROXY: 15 ng/mL — AB (ref 30–89)

## 2014-04-05 MED ORDER — OXYCODONE HCL 5 MG PO TABS
10.0000 mg | ORAL_TABLET | ORAL | Status: DC | PRN
  Administered 2014-04-06: 15 mg via ORAL
  Administered 2014-04-06: 10 mg via ORAL
  Administered 2014-04-07 (×2): 15 mg via ORAL
  Filled 2014-04-05: qty 2
  Filled 2014-04-05 (×3): qty 3

## 2014-04-05 MED ORDER — ACETAMINOPHEN 500 MG PO TABS
1000.0000 mg | ORAL_TABLET | Freq: Four times a day (QID) | ORAL | Status: DC
  Administered 2014-04-05 – 2014-04-08 (×10): 1000 mg via ORAL
  Filled 2014-04-05 (×18): qty 2

## 2014-04-05 NOTE — Progress Notes (Signed)
I have reviewed and discussed in detail with Mr. Paul the patient's presentation, examination findings, and I formulated the plan outlined above.  Toniann Dickerson, MD [facility] [phone] (p)   
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
## 2014-04-05 NOTE — Progress Notes (Signed)
Utilization review completed.  
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
## 2014-04-05 NOTE — Anesthesia Postprocedure Evaluation (Signed)
Anesthesia Post Note  Patient: Curtis [name]  Procedure(s) Performed: Procedure(s) (LRB): OPEN REDUCTION INTERNAL FIXATION (ORIF) ACETABULAR FRACTURE (Left) OPEN REDUCTION INTERNAL (ORIF) FIXATION PATELLA (Left)  Anesthesia type: general  Patient location: PACU  Post pain: Pain level controlled  Post assessment: Patient's Cardiovascular Status Stable  Post vital signs: Reviewed and stable  Level of consciousness: sedated  Complications: No apparent anesthesia complications
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
## 2014-04-06 ENCOUNTER — Ambulatory Visit
Admit: 2014-04-06 | Discharge: 2014-04-06 | Disposition: A | Discharge status: Home / Self Care | Payer: BC Managed Care – PPO | Attending: Radiation Oncology | Admitting: Radiation Oncology

## 2014-04-06 DIAGNOSIS — S32402A Unspecified fracture of left acetabulum, initial encounter for closed fracture: Secondary | ICD-10-CM

## 2014-04-06 LAB — VITAMIN B12: Vitamin B-12: 228 pg/mL (ref 211–911)

## 2014-04-06 LAB — CBC
HCT: 29.9 % — ABNORMAL LOW (ref 39.0–52.0)
HEMOGLOBIN: 9.9 g/dL — AB (ref 13.0–17.0)
MCH: 29.4 pg (ref 26.0–34.0)
MCHC: 33.1 g/dL (ref 30.0–36.0)
MCV: 88.7 fL (ref 78.0–100.0)
PLATELETS: 200 10*3/uL (ref 150–400)
RBC: 3.37 MIL/uL — ABNORMAL LOW (ref 4.22–5.81)
RDW: 12.4 % (ref 11.5–15.5)
WBC: 12.7 10*3/uL — ABNORMAL HIGH (ref 4.0–10.5)

## 2014-04-06 LAB — COMPREHENSIVE METABOLIC PANEL
ALK PHOS: 71 U/L (ref 39–117)
ALT: 27 U/L (ref 0–53)
AST: 76 U/L — ABNORMAL HIGH (ref 0–37)
Albumin: 2.6 g/dL — ABNORMAL LOW (ref 3.5–5.2)
Anion gap: 6 (ref 5–15)
BILIRUBIN TOTAL: 0.9 mg/dL (ref 0.3–1.2)
BUN: 3 mg/dL — ABNORMAL LOW (ref 6–23)
CHLORIDE: 99 meq/L (ref 96–112)
CO2: 32 meq/L (ref 19–32)
Calcium: 8.8 mg/dL (ref 8.4–10.5)
Creatinine, Ser: 0.79 mg/dL (ref 0.50–1.35)
GFR calc Af Amer: >90 mL/min (ref >90)
GLUCOSE: 113 mg/dL — AB (ref 70–99)
POTASSIUM: 3.6 meq/L — AB (ref 3.7–5.3)
SODIUM: 137 meq/L (ref 137–147)
Total Protein: 5.9 g/dL — ABNORMAL LOW (ref 6.0–8.3)

## 2014-04-06 LAB — TESTOSTERONE, % FREE: TESTOSTERONE-% FREE: 2 % — AB (ref 1.6–2.9)

## 2014-04-06 LAB — TSH: TSH: 1.18 u[IU]/mL (ref 0.350–4.500)

## 2014-04-06 LAB — RETICULOCYTES
RBC.: 3.37 MIL/uL — ABNORMAL LOW (ref 4.22–5.81)
RETIC COUNT ABSOLUTE: 60.7 10*3/uL (ref 19.0–186.0)
Retic Ct Pct: 1.8 % (ref 0.4–3.1)

## 2014-04-06 LAB — IRON AND TIBC
Iron: <10 ug/dL — ABNORMAL LOW (ref 42–135)
UIBC: 148 ug/dL (ref 125–400)

## 2014-04-06 LAB — FERRITIN: FERRITIN: 453 ng/mL — AB (ref 22–322)

## 2014-04-06 LAB — MAGNESIUM: Magnesium: 1.9 mg/dL (ref 1.5–2.5)

## 2014-04-06 LAB — PREALBUMIN: Prealbumin: 9 mg/dL — ABNORMAL LOW (ref 17.0–34.0)

## 2014-04-06 LAB — HIV ANTIBODY (ROUTINE TESTING W REFLEX): HIV 1&2 Ab, 4th Generation: NONREACTIVE

## 2014-04-06 LAB — TESTOSTERONE: Testosterone: 31 ng/dL — ABNORMAL LOW (ref 300–890)

## 2014-04-06 LAB — PROTIME-INR
INR: 1.55 — ABNORMAL HIGH (ref 0.00–1.49)
PROTHROMBIN TIME: 18.6 s — AB (ref 11.6–15.2)

## 2014-04-06 LAB — SEX HORMONE BINDING GLOBULIN: SEX HORMONE BINDING: 27 nmol/L (ref 13–71)

## 2014-04-06 LAB — HEMOGLOBIN A1C
Hgb A1c MFr Bld: 5.9 % — ABNORMAL HIGH (ref <5.7)
MEAN PLASMA GLUCOSE: 123 mg/dL — AB (ref <117)

## 2014-04-06 LAB — TESTOSTERONE, FREE: Testosterone, Free: 6.2 pg/mL — ABNORMAL LOW (ref 47.0–244.0)

## 2014-04-06 LAB — FOLATE: Folate: 3.6 ng/mL

## 2014-04-06 LAB — PHOSPHORUS: Phosphorus: 3 mg/dL (ref 2.3–4.6)

## 2014-04-06 MED ORDER — CEFAZOLIN SODIUM-DEXTROSE 2-3 GM-% IV SOLR
2.0000 g | Freq: Once | INTRAVENOUS | Status: AC
  Administered 2014-04-07: 2 g via INTRAVENOUS
  Filled 2014-04-06: qty 50

## 2014-04-06 MED ORDER — TRAMADOL HCL 50 MG PO TABS
50.0000 mg | ORAL_TABLET | Freq: Three times a day (TID) | ORAL | Status: DC
  Administered 2014-04-06 – 2014-04-07 (×3): 100 mg via ORAL
  Administered 2014-04-08: 50 mg via ORAL
  Filled 2014-04-06 (×4): qty 2

## 2014-04-06 NOTE — Progress Notes (Addendum)
To Curtis [name] for radiation tx.1344 Back from [facility] per [facility]
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
## 2014-04-06 NOTE — Progress Notes (Signed)
  Radiation Oncology         [phone] ________________________________  Name: Curtis [name] MRN: [id number]  Date: 04/06/2014  DOB: [birth year]  End of Treatment Note  Diagnosis:   Left acetabular fracture     Indication for treatment:  curative       Radiation treatment dates:   04/06/2014  Site/dose:   Left hip/ 7 Gy in 1 fraction  Beams/energy:   AP/PA with 10 MV photons  Narrative: The patient tolerated radiation treatment relatively well.   He was discharged back to [facility] in stable condition.    ------------------------------------------------  Lurline Hare, MD
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
## 2014-04-06 NOTE — Progress Notes (Signed)
[facility] Radiation Oncology Dept Therapy Treatment Record Phone [phone]   Radiation Therapy was administered to Curtis [name] on: 04/06/2014  1:11 PM and was treatment # 1 out of a planned course of 1 treatments.
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
## 2014-04-07 ENCOUNTER — Inpatient Hospital Stay (HOSPITAL_COMMUNITY): Payer: BC Managed Care – PPO | Admitting: Anesthesiology

## 2014-04-07 ENCOUNTER — Encounter (HOSPITAL_COMMUNITY): Payer: BC Managed Care – PPO | Admitting: Anesthesiology

## 2014-04-07 ENCOUNTER — Inpatient Hospital Stay (HOSPITAL_COMMUNITY): Payer: BC Managed Care – PPO

## 2014-04-07 ENCOUNTER — Encounter (HOSPITAL_COMMUNITY)
Admission: EM | Disposition: A | Discharge status: Home / Self Care | Payer: Self-pay | Source: Home / Self Care | Attending: Orthopedic Surgery

## 2014-04-07 ENCOUNTER — Encounter: Payer: Self-pay | Admitting: Orthopedic Surgery

## 2014-04-07 DIAGNOSIS — M238X2 Other internal derangements of left knee: Secondary | ICD-10-CM | POA: Diagnosis present

## 2014-04-07 DIAGNOSIS — E349 Endocrine disorder, unspecified: Secondary | ICD-10-CM

## 2014-04-07 DIAGNOSIS — M238X1 Other internal derangements of right knee: Secondary | ICD-10-CM | POA: Diagnosis present

## 2014-04-07 DIAGNOSIS — S6291XB Unspecified fracture of right wrist and hand, initial encounter for open fracture: Secondary | ICD-10-CM | POA: Diagnosis present

## 2014-04-07 DIAGNOSIS — E611 Iron deficiency: Secondary | ICD-10-CM

## 2014-04-07 DIAGNOSIS — E559 Vitamin D deficiency, unspecified: Secondary | ICD-10-CM

## 2014-04-07 HISTORY — DX: Vitamin D deficiency, unspecified: E55.9

## 2014-04-07 HISTORY — DX: Unspecified fracture of right wrist and hand, initial encounter for open fracture: S62.91XB

## 2014-04-07 HISTORY — PX: KNEE RECONSTRUCTION: SHX5883

## 2014-04-07 HISTORY — PX: REPAIR EXTENSOR TENDON: SHX5382

## 2014-04-07 HISTORY — DX: Iron deficiency: E61.1

## 2014-04-07 HISTORY — DX: Other internal derangements of right knee: M23.8X1

## 2014-04-07 HISTORY — DX: Other internal derangements of left knee: M23.8X2

## 2014-04-07 HISTORY — PX: OPEN REDUCTION INTERNAL FIXATION (ORIF) METACARPAL: SHX6234

## 2014-04-07 HISTORY — DX: Endocrine disorder, unspecified: E34.9

## 2014-04-07 LAB — PROTIME-INR
INR: 1.87 — ABNORMAL HIGH (ref 0.00–1.49)
Prothrombin Time: 21.5 seconds — ABNORMAL HIGH (ref 11.6–15.2)

## 2014-04-07 LAB — PTH, INTACT AND CALCIUM
Calcium, Total (PTH): 8.3 mg/dL — ABNORMAL LOW (ref 8.4–10.5)
PTH: 38 pg/mL (ref 14–64)

## 2014-04-07 LAB — CBC
HCT: 28 % — ABNORMAL LOW (ref 39.0–52.0)
Hemoglobin: 9.6 g/dL — ABNORMAL LOW (ref 13.0–17.0)
MCH: 30.7 pg (ref 26.0–34.0)
MCHC: 34.3 g/dL (ref 30.0–36.0)
MCV: 89.5 fL (ref 78.0–100.0)
PLATELETS: 224 10*3/uL (ref 150–400)
RBC: 3.13 MIL/uL — ABNORMAL LOW (ref 4.22–5.81)
RDW: 12.4 % (ref 11.5–15.5)
WBC: 9.7 10*3/uL (ref 4.0–10.5)

## 2014-04-07 LAB — TYPE AND SCREEN
ABO/RH(D): A POS
Antibody Screen: NEGATIVE

## 2014-04-07 SURGERY — RECONSTRUCTION, KNEE
Anesthesia: General | Laterality: Right

## 2014-04-07 MED ORDER — HYDROMORPHONE HCL 1 MG/ML IJ SOLN
INTRAMUSCULAR | Status: AC
  Administered 2014-04-07: 0.5 mg via INTRAVENOUS
  Filled 2014-04-07: qty 1

## 2014-04-07 MED ORDER — 0.9 % SODIUM CHLORIDE (POUR BTL) OPTIME
TOPICAL | Status: DC | PRN
  Administered 2014-04-07: 1000 mL

## 2014-04-07 MED ORDER — GLYCOPYRROLATE 0.2 MG/ML IJ SOLN
INTRAMUSCULAR | Status: AC
  Filled 2014-04-07: qty 2

## 2014-04-07 MED ORDER — LIDOCAINE HCL (CARDIAC) 20 MG/ML IV SOLN
INTRAVENOUS | Status: DC | PRN
  Administered 2014-04-07: 20 mg via INTRAVENOUS

## 2014-04-07 MED ORDER — LACTATED RINGERS IV SOLN
INTRAVENOUS | Status: DC
  Administered 2014-04-07: 12:00:00 via INTRAVENOUS

## 2014-04-07 MED ORDER — ONDANSETRON HCL 4 MG/2ML IJ SOLN
INTRAMUSCULAR | Status: AC
  Filled 2014-04-07: qty 2

## 2014-04-07 MED ORDER — GLYCOPYRROLATE 0.2 MG/ML IJ SOLN
INTRAMUSCULAR | Status: DC | PRN
  Administered 2014-04-07: 0.4 mg via INTRAVENOUS

## 2014-04-07 MED ORDER — WARFARIN - PHARMACIST DOSING INPATIENT
Freq: Every day | Status: DC
  Administered 2014-04-09 – 2014-04-10 (×2)

## 2014-04-07 MED ORDER — NEOSTIGMINE METHYLSULFATE 10 MG/10ML IV SOLN
INTRAVENOUS | Status: AC
  Filled 2014-04-07: qty 1

## 2014-04-07 MED ORDER — BUPIVACAINE HCL (PF) 0.25 % IJ SOLN
INTRAMUSCULAR | Status: AC
  Filled 2014-04-07: qty 30

## 2014-04-07 MED ORDER — ONDANSETRON HCL 4 MG/2ML IJ SOLN
4.0000 mg | Freq: Four times a day (QID) | INTRAMUSCULAR | Status: DC | PRN
  Filled 2014-04-07: qty 2

## 2014-04-07 MED ORDER — LACTATED RINGERS IV SOLN
INTRAVENOUS | Status: DC | PRN
  Administered 2014-04-07 (×3): via INTRAVENOUS

## 2014-04-07 MED ORDER — OXYCODONE HCL 5 MG PO TABS
5.0000 mg | ORAL_TABLET | ORAL | Status: DC | PRN
  Administered 2014-04-08 (×2): 10 mg via ORAL
  Filled 2014-04-07 (×2): qty 2

## 2014-04-07 MED ORDER — 0.9 % SODIUM CHLORIDE (POUR BTL) OPTIME: TOPICAL | Status: DC | PRN

## 2014-04-07 MED ORDER — DIPHENHYDRAMINE HCL 50 MG/ML IJ SOLN
12.5000 mg | Freq: Four times a day (QID) | INTRAMUSCULAR | Status: DC | PRN
  Filled 2014-04-07: qty 0.25

## 2014-04-07 MED ORDER — LIDOCAINE HCL (CARDIAC) 20 MG/ML IV SOLN
INTRAVENOUS | Status: AC
  Filled 2014-04-07: qty 5

## 2014-04-07 MED ORDER — PROMETHAZINE HCL 25 MG/ML IJ SOLN: 6.2500 mg | INTRAMUSCULAR | Status: DC | PRN

## 2014-04-07 MED ORDER — FENTANYL CITRATE 0.05 MG/ML IJ SOLN
INTRAMUSCULAR | Status: AC
  Filled 2014-04-07: qty 5

## 2014-04-07 MED ORDER — NEOSTIGMINE METHYLSULFATE 10 MG/10ML IV SOLN
INTRAVENOUS | Status: DC | PRN
  Administered 2014-04-07: 3 mg via INTRAVENOUS

## 2014-04-07 MED ORDER — FENTANYL CITRATE 0.05 MG/ML IJ SOLN
INTRAMUSCULAR | Status: DC | PRN
  Administered 2014-04-07: 150 ug via INTRAVENOUS
  Administered 2014-04-07: 100 ug via INTRAVENOUS
  Administered 2014-04-07 (×5): 50 ug via INTRAVENOUS
  Administered 2014-04-07: 100 ug via INTRAVENOUS
  Administered 2014-04-07 (×2): 50 ug via INTRAVENOUS

## 2014-04-07 MED ORDER — PROPOFOL 10 MG/ML IV BOLUS
INTRAVENOUS | Status: DC | PRN
  Administered 2014-04-07: 200 mg via INTRAVENOUS

## 2014-04-07 MED ORDER — WARFARIN SODIUM 5 MG PO TABS
5.0000 mg | ORAL_TABLET | Freq: Once | ORAL | Status: AC
  Administered 2014-04-07: 5 mg via ORAL
  Filled 2014-04-07: qty 1

## 2014-04-07 MED ORDER — CEFAZOLIN SODIUM 1-5 GM-% IV SOLN
1.0000 g | Freq: Four times a day (QID) | INTRAVENOUS | Status: AC
  Administered 2014-04-07 – 2014-04-08 (×3): 1 g via INTRAVENOUS
  Filled 2014-04-07 (×4): qty 50

## 2014-04-07 MED ORDER — HYDROMORPHONE 0.3 MG/ML IV SOLN
INTRAVENOUS | Status: DC
  Administered 2014-04-07: 19:00:00 via INTRAVENOUS
  Administered 2014-04-08: 4 mg via INTRAVENOUS
  Administered 2014-04-08: 3.57 mg via INTRAVENOUS
  Administered 2014-04-08: 04:00:00 via INTRAVENOUS
  Filled 2014-04-07: qty 25

## 2014-04-07 MED ORDER — OXYCODONE HCL 5 MG PO TABS: 5.0000 mg | ORAL_TABLET | Freq: Once | ORAL | Status: DC | PRN

## 2014-04-07 MED ORDER — NALOXONE HCL 0.4 MG/ML IJ SOLN
0.4000 mg | INTRAMUSCULAR | Status: DC | PRN
  Filled 2014-04-07: qty 1

## 2014-04-07 MED ORDER — BUPIVACAINE HCL (PF) 0.25 % IJ SOLN
INTRAMUSCULAR | Status: DC | PRN
Start: 2014-04-07 — End: 2014-04-07
  Administered 2014-04-07: 5 mL

## 2014-04-07 MED ORDER — ROCURONIUM BROMIDE 100 MG/10ML IV SOLN
INTRAVENOUS | Status: DC | PRN
  Administered 2014-04-07: 20 mg via INTRAVENOUS
  Administered 2014-04-07: 10 mg via INTRAVENOUS
  Administered 2014-04-07: 20 mg via INTRAVENOUS
  Administered 2014-04-07: 50 mg via INTRAVENOUS

## 2014-04-07 MED ORDER — ONDANSETRON HCL 4 MG/2ML IJ SOLN
INTRAMUSCULAR | Status: DC | PRN
  Administered 2014-04-07: 4 mg via INTRAVENOUS

## 2014-04-07 MED ORDER — MIDAZOLAM HCL 2 MG/2ML IJ SOLN
INTRAMUSCULAR | Status: AC
  Filled 2014-04-07: qty 2

## 2014-04-07 MED ORDER — ROCURONIUM BROMIDE 50 MG/5ML IV SOLN
INTRAVENOUS | Status: AC
  Filled 2014-04-07: qty 1

## 2014-04-07 MED ORDER — DIPHENHYDRAMINE HCL 12.5 MG/5ML PO ELIX
12.5000 mg | ORAL_SOLUTION | Freq: Four times a day (QID) | ORAL | Status: DC | PRN
Start: 2014-04-07 — End: 2014-04-08
  Filled 2014-04-07: qty 5

## 2014-04-07 MED ORDER — SODIUM CHLORIDE 0.9 % IJ SOLN
9.0000 mL | INTRAMUSCULAR | Status: DC | PRN
Start: 2014-04-07 — End: 2014-04-08

## 2014-04-07 MED ORDER — MIDAZOLAM HCL 5 MG/5ML IJ SOLN
INTRAMUSCULAR | Status: DC | PRN
  Administered 2014-04-07: 2 mg via INTRAVENOUS

## 2014-04-07 MED ORDER — OXYCODONE HCL 5 MG/5ML PO SOLN: 5.0000 mg | Freq: Once | ORAL | Status: DC | PRN

## 2014-04-07 MED ORDER — PROPOFOL 10 MG/ML IV BOLUS
INTRAVENOUS | Status: AC
  Filled 2014-04-07: qty 20

## 2014-04-07 MED ORDER — GLYCOPYRROLATE 0.2 MG/ML IJ SOLN
INTRAMUSCULAR | Status: AC
  Filled 2014-04-07: qty 3

## 2014-04-07 MED ORDER — HYDROMORPHONE HCL 1 MG/ML IJ SOLN
0.2500 mg | INTRAMUSCULAR | Status: DC | PRN
Start: 2014-04-07 — End: 2014-04-07
  Administered 2014-04-07 (×4): 0.5 mg via INTRAVENOUS

## 2014-04-07 MED ORDER — HYDROMORPHONE 0.3 MG/ML IV SOLN
INTRAVENOUS | Status: AC
  Filled 2014-04-07: qty 25

## 2014-04-07 SURGICAL SUPPLY — 76 items
BANDAGE ELASTIC 3 VELCRO ST LF (GAUZE/BANDAGES/DRESSINGS) ×4 IMPLANT
BANDAGE ELASTIC 4 VELCRO ST LF (GAUZE/BANDAGES/DRESSINGS) ×4 IMPLANT
BANDAGE ELASTIC 6 VELCRO ST LF (GAUZE/BANDAGES/DRESSINGS) IMPLANT
BANDAGE ESMARK 6X9 LF (GAUZE/BANDAGES/DRESSINGS) ×2 IMPLANT
BLADE SURG 10 STRL SS (BLADE) ×8 IMPLANT
BLADE SURG 15 STRL LF DISP TIS (BLADE) ×4 IMPLANT
BLADE SURG 15 STRL SS (BLADE) ×4
BLADE SURG ROTATE 9660 (MISCELLANEOUS) ×4 IMPLANT
BNDG ESMARK 6X9 LF (GAUZE/BANDAGES/DRESSINGS) ×4
BNDG GAUZE ELAST 4 BULKY (GAUZE/BANDAGES/DRESSINGS) ×4 IMPLANT
COVER MAYO STAND STRL (DRAPES) ×4 IMPLANT
COVER SURGICAL LIGHT HANDLE (MISCELLANEOUS) ×8 IMPLANT
CUFF TOURNIQUET SINGLE 34IN LL (TOURNIQUET CUFF) IMPLANT
DRAIN PENROSE 1/4X12 LTX STRL (WOUND CARE) ×4 IMPLANT
DRAPE C-ARM 42X72 X-RAY (DRAPES) ×4 IMPLANT
DRAPE C-ARMOR (DRAPES) IMPLANT
DRAPE PROXIMA HALF (DRAPES) ×4 IMPLANT
DRAPE U-SHAPE 47X51 STRL (DRAPES) ×4 IMPLANT
DRSG ADAPTIC 3X8 NADH LF (GAUZE/BANDAGES/DRESSINGS) ×4 IMPLANT
DRSG PAD ABDOMINAL 8X10 ST (GAUZE/BANDAGES/DRESSINGS) ×4 IMPLANT
ELECT REM PT RETURN 9FT ADLT (ELECTROSURGICAL)
ELECTRODE REM PT RTRN 9FT ADLT (ELECTROSURGICAL) IMPLANT
GAUZE SPONGE 4X4 12PLY STRL (GAUZE/BANDAGES/DRESSINGS) IMPLANT
GAUZE XEROFORM 1X8 LF (GAUZE/BANDAGES/DRESSINGS) IMPLANT
GLOVE BIO SURGEON STRL SZ7.5 (GLOVE) ×4 IMPLANT
GLOVE BIO SURGEON STRL SZ8 (GLOVE) ×8 IMPLANT
GLOVE BIOGEL PI IND STRL 7.5 (GLOVE) ×2 IMPLANT
GLOVE BIOGEL PI IND STRL 8 (GLOVE) ×2 IMPLANT
GLOVE BIOGEL PI INDICATOR 7.5 (GLOVE) ×2
GLOVE BIOGEL PI INDICATOR 8 (GLOVE) ×2
GLOVE SURG SYN 7.5  E (GLOVE) ×2
GLOVE SURG SYN 7.5 E (GLOVE) ×2 IMPLANT
GOWN STRL REUS W/ TWL LRG LVL3 (GOWN DISPOSABLE) ×4 IMPLANT
GOWN STRL REUS W/TWL LRG LVL3 (GOWN DISPOSABLE) ×4
K-WIRE .035X5.75  STT K (WIRE) ×4 IMPLANT
KIT BASIN OR (CUSTOM PROCEDURE TRAY) ×4 IMPLANT
KIT BIO-TENODESIS 3X8 DISP (MISCELLANEOUS) ×2
KIT INSRT BABSR STRL DISP BTN (MISCELLANEOUS) ×2 IMPLANT
KIT ROOM TURNOVER OR (KITS) ×4 IMPLANT
KIT TRANSTIBIAL (DISPOSABLE) ×8 IMPLANT
MANIFOLD NEPTUNE II (INSTRUMENTS) IMPLANT
NDL SUT 6 .5 CRC .975X.05 MAYO (NEEDLE) ×2 IMPLANT
NEEDLE HYPO 25GX1X1/2 BEV (NEEDLE) ×4 IMPLANT
NEEDLE MAYO TAPER (NEEDLE) ×2
PAD ARMBOARD 7.5X6 YLW CONV (MISCELLANEOUS) ×8 IMPLANT
PAD CAST 4YDX4 CTTN HI CHSV (CAST SUPPLIES) ×4 IMPLANT
PADDING CAST COTTON 4X4 STRL (CAST SUPPLIES) ×4
PADDING CAST COTTON 6X4 STRL (CAST SUPPLIES) IMPLANT
PENCIL BUTTON HOLSTER BLD 10FT (ELECTRODE) ×8 IMPLANT
SCREW BIO INTERFER WSHEATH28M (Screw) ×4 IMPLANT
SCREW INTERFERENCE BIO 6X23 (Screw) ×4 IMPLANT
SPLINT PLASTER CAST XFAST 4X15 (CAST SUPPLIES) ×2 IMPLANT
SPLINT PLASTER XTRA FAST SET 4 (CAST SUPPLIES) ×2
SPONGE GAUZE 4X4 12PLY STER LF (GAUZE/BANDAGES/DRESSINGS) ×4 IMPLANT
SPONGE LAP 4X18 X RAY DECT (DISPOSABLE) ×4 IMPLANT
STAPLER VISISTAT 35W (STAPLE) ×4 IMPLANT
SUCTION FRAZIER TIP 10 FR DISP (SUCTIONS) ×4 IMPLANT
SUT 2 FIBERLOOP 20 STRT BLUE (SUTURE) ×12
SUT ETHILON 3 0 PS 1 (SUTURE) ×4 IMPLANT
SUT MNCRL AB 3-0 PS2 27 (SUTURE) IMPLANT
SUT VIC AB 0 CT1 27 (SUTURE) ×2
SUT VIC AB 0 CT1 27XBRD ANBCTR (SUTURE) ×2 IMPLANT
SUT VIC AB 1 CT1 27 (SUTURE) ×2
SUT VIC AB 1 CT1 27XBRD ANBCTR (SUTURE) ×2 IMPLANT
SUT VIC AB 2-0 CT1 27 (SUTURE) ×2
SUT VIC AB 2-0 CT1 TAPERPNT 27 (SUTURE) ×2 IMPLANT
SUTURE 2 FIBERLOOP 20 STRT BLU (SUTURE) ×6 IMPLANT
SYR CONTROL 10ML LL (SYRINGE) IMPLANT
TENDON ANTERIOR TIBIALIS (Tissue) ×4 IMPLANT
TOWEL OR 17X24 6PK STRL BLUE (TOWEL DISPOSABLE) ×4 IMPLANT
TOWEL OR 17X26 10 PK STRL BLUE (TOWEL DISPOSABLE) ×8 IMPLANT
TUBE CONNECTING 12'X1/4 (SUCTIONS) ×1
TUBE CONNECTING 12X1/4 (SUCTIONS) ×3 IMPLANT
WAND HAND CNTRL MULTIVAC 90 (MISCELLANEOUS) ×4 IMPLANT
WATER STERILE IRR 1000ML POUR (IV SOLUTION) ×4 IMPLANT
YANKAUER SUCT BULB TIP NO VENT (SUCTIONS) ×4 IMPLANT
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
## 2014-04-07 NOTE — Transfer of Care (Signed)
Immediate Anesthesia Transfer of Care Note  Patient: Curtis [name]  Procedure(s) Performed: Procedure(s): RIGHT KNEE RECONSTRUCTION POSTERIOR LATERAL CORNER (Right) OPEN REDUCTION INTERNAL FIXATION (ORIF) RIGHT INDEX METACARPAL (Right) REPAIR EXTENSOR TENDONS  Patient Location: PACU  Anesthesia Type:General  Level of Consciousness: awake, alert , oriented and patient cooperative  Airway & Oxygen Therapy: Patient Spontanous Breathing and Patient connected to nasal cannula oxygen  Post-op Assessment: Report given to PACU RN, Post -op Vital signs reviewed and stable and Patient moving all extremities  Post vital signs: Reviewed and stable  Complications: No apparent anesthesia complications
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
## 2014-04-07 NOTE — Progress Notes (Signed)
PT Cancellation Note  Patient Details Name: Curtis [name] MRN: [id number] DOB: [birth year]   Cancelled Treatment:    Reason Eval/Treat Not Completed: Patient at procedure or test/unavailable.  Pt to OR today.  Will f/u tomorrow.     Mitul Hallowell, Alison Murray 04/07/2014, 12:38 PM

## 2014-04-07 NOTE — Op Note (Signed)
See dictated note [phone]
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
## 2014-04-07 NOTE — Brief Op Note (Signed)
  I discussed with the patient the risks and benefits of surgery, including the possibility of infection, nerve injury, vessel injury, wound breakdown, arthritis, symptomatic hardware, DVT/ PE, loss of motion, and need for further surgery among others.  We also specifically discussed the potential for injury to the peroneal nerve.  He acknowledged these these risks and wished to proceed.  Myrene Galas, MD [facility] [phone] (p)
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
## 2014-04-08 LAB — BASIC METABOLIC PANEL WITH GFR
Anion gap: 9 (ref 5–15)
BUN: 5 mg/dL — ABNORMAL LOW (ref 6–23)
CO2: 32 meq/L (ref 19–32)
Calcium: 8.9 mg/dL (ref 8.4–10.5)
Chloride: 97 meq/L (ref 96–112)
Creatinine, Ser: 0.72 mg/dL (ref 0.50–1.35)
GFR calc Af Amer: >90 mL/min
GFR calc non Af Amer: >90 mL/min
Glucose, Bld: 108 mg/dL — ABNORMAL HIGH (ref 70–99)
Potassium: 4 meq/L (ref 3.7–5.3)
Sodium: 138 meq/L (ref 137–147)

## 2014-04-08 LAB — PROTIME-INR
INR: 2.98 — ABNORMAL HIGH (ref 0.00–1.49)
PROTHROMBIN TIME: 31 s — AB (ref 11.6–15.2)

## 2014-04-08 LAB — PROLACTIN: PROLACTIN: 11.2 ng/mL (ref 2.1–17.1)

## 2014-04-08 LAB — VITAMIN D 1,25 DIHYDROXY
Vitamin D 1, 25 (OH)2 Total: 19 pg/mL (ref 18–72)
Vitamin D2 1, 25 (OH)2: <8 pg/mL
Vitamin D3 1, 25 (OH)2: 19 pg/mL

## 2014-04-08 LAB — CBC
HCT: 26.6 % — ABNORMAL LOW (ref 39.0–52.0)
Hemoglobin: 9 g/dL — ABNORMAL LOW (ref 13.0–17.0)
MCH: 30 pg (ref 26.0–34.0)
MCHC: 33.8 g/dL (ref 30.0–36.0)
MCV: 88.7 fL (ref 78.0–100.0)
Platelets: 280 10*3/uL (ref 150–400)
RBC: 3 MIL/uL — ABNORMAL LOW (ref 4.22–5.81)
RDW: 12.6 % (ref 11.5–15.5)
WBC: 11.3 10*3/uL — ABNORMAL HIGH (ref 4.0–10.5)

## 2014-04-08 LAB — FOLLICLE STIMULATING HORMONE: FSH: 0.8 m[IU]/mL — AB (ref 1.4–18.1)

## 2014-04-08 LAB — TESTOSTERONE: TESTOSTERONE: 25 ng/dL — AB (ref 300–890)

## 2014-04-08 LAB — LUTEINIZING HORMONE: LH: 1.6 m[IU]/mL (ref 1.5–9.3)

## 2014-04-08 LAB — TRANSFERRIN: TRANSFERRIN: 121 mg/dL — AB (ref 200–360)

## 2014-04-08 MED ORDER — ADULT MULTIVITAMIN W/MINERALS CH
1.0000 | ORAL_TABLET | Freq: Every day | ORAL | Status: DC
  Administered 2014-04-08 – 2014-04-11 (×4): 1 via ORAL
  Filled 2014-04-08 (×4): qty 1

## 2014-04-08 MED ORDER — OXYCODONE HCL 5 MG PO TABS
5.0000 mg | ORAL_TABLET | Freq: Four times a day (QID) | ORAL | Status: DC | PRN
  Administered 2014-04-08: 10 mg via ORAL
  Administered 2014-04-08: 5 mg via ORAL
  Administered 2014-04-09 – 2014-04-11 (×7): 10 mg via ORAL
  Filled 2014-04-08 (×6): qty 2
  Filled 2014-04-08: qty 1
  Filled 2014-04-08 (×3): qty 2

## 2014-04-08 MED ORDER — PREGABALIN 75 MG PO CAPS: 75.0000 mg | ORAL_CAPSULE | Freq: Two times a day (BID) | ORAL | Status: AC

## 2014-04-08 MED ORDER — DSS 100 MG PO CAPS
100.0000 mg | ORAL_CAPSULE | Freq: Two times a day (BID) | ORAL | Status: AC
Start: 2014-04-08 — End: ?

## 2014-04-08 MED ORDER — OXYCODONE-ACETAMINOPHEN 5-325 MG PO TABS
1.0000 | ORAL_TABLET | Freq: Four times a day (QID) | ORAL | Status: DC | PRN
  Administered 2014-04-08 – 2014-04-11 (×10): 2 via ORAL
  Filled 2014-04-08 (×10): qty 2

## 2014-04-08 MED ORDER — METHOCARBAMOL 500 MG PO TABS
1000.0000 mg | ORAL_TABLET | Freq: Four times a day (QID) | ORAL | Status: DC
  Administered 2014-04-08 – 2014-04-11 (×14): 1000 mg via ORAL
  Filled 2014-04-08 (×20): qty 2

## 2014-04-08 MED ORDER — POLYETHYLENE GLYCOL 3350 17 G PO PACK: 17.0000 g | PACK | Freq: Every day | ORAL | Status: AC

## 2014-04-08 MED ORDER — FERROUS SULFATE 325 (65 FE) MG PO TABS: 325.0000 mg | ORAL_TABLET | Freq: Three times a day (TID) | ORAL | Status: AC

## 2014-04-08 MED ORDER — TRAMADOL HCL 50 MG PO TABS
100.0000 mg | ORAL_TABLET | Freq: Three times a day (TID) | ORAL | Status: DC
  Administered 2014-04-09 – 2014-04-11 (×8): 100 mg via ORAL
  Filled 2014-04-08 (×8): qty 2

## 2014-04-08 MED ORDER — OXYCODONE HCL 5 MG PO TABS: 5.0000 mg | ORAL_TABLET | ORAL | Status: AC | PRN

## 2014-04-08 MED ORDER — OXYCODONE HCL 5 MG PO TABS: 5.0000 mg | ORAL_TABLET | ORAL | Status: DC | PRN

## 2014-04-08 MED ORDER — METHOCARBAMOL 500 MG PO TABS: 500.0000 mg | ORAL_TABLET | Freq: Four times a day (QID) | ORAL | Status: AC

## 2014-04-08 MED ORDER — VITAMIN D (ERGOCALCIFEROL) 1.25 MG (50000 UNIT) PO CAPS: 50000.0000 [IU] | ORAL_CAPSULE | ORAL | Status: AC

## 2014-04-08 MED ORDER — VITAMIN C 500 MG PO TABS
500.0000 mg | ORAL_TABLET | Freq: Two times a day (BID) | ORAL | Status: DC
  Administered 2014-04-08 – 2014-04-11 (×7): 500 mg via ORAL
  Filled 2014-04-08 (×8): qty 1

## 2014-04-08 MED ORDER — VITAMIN D (ERGOCALCIFEROL) 1.25 MG (50000 UNIT) PO CAPS
50000.0000 [IU] | ORAL_CAPSULE | ORAL | Status: DC
  Administered 2014-04-08: 50000 [IU] via ORAL
  Filled 2014-04-08: qty 1

## 2014-04-08 MED ORDER — ASCORBIC ACID 500 MG PO TABS: 500.0000 mg | ORAL_TABLET | Freq: Two times a day (BID) | ORAL | Status: AC

## 2014-04-08 MED ORDER — HYDROMORPHONE HCL 1 MG/ML IJ SOLN
1.0000 mg | INTRAMUSCULAR | Status: DC | PRN
  Administered 2014-04-08 – 2014-04-11 (×14): 1 mg via INTRAVENOUS
  Filled 2014-04-08 (×13): qty 1

## 2014-04-08 MED ORDER — CIPROFLOXACIN-DEXAMETHASONE 0.3-0.1 % OT SUSP: 4.0000 [drp] | Freq: Two times a day (BID) | OTIC | Status: AC

## 2014-04-08 MED ORDER — PREGABALIN 75 MG PO CAPS
75.0000 mg | ORAL_CAPSULE | Freq: Two times a day (BID) | ORAL | Status: DC
  Administered 2014-04-08 – 2014-04-11 (×7): 75 mg via ORAL
  Filled 2014-04-08 (×7): qty 1

## 2014-04-08 MED ORDER — ENOXAPARIN SODIUM 40 MG/0.4ML ~~LOC~~ SOLN: 40.0000 mg | SUBCUTANEOUS | Status: AC

## 2014-04-08 MED ORDER — ADULT MULTIVITAMIN W/MINERALS CH: 1.0000 | ORAL_TABLET | Freq: Every day | ORAL | Status: AC

## 2014-04-08 MED ORDER — OXYCODONE-ACETAMINOPHEN 10-325 MG PO TABS
1.0000 | ORAL_TABLET | Freq: Four times a day (QID) | ORAL | Status: AC | PRN
Start: 2014-04-08 — End: ?

## 2014-04-08 MED ORDER — METHOCARBAMOL 1000 MG/10ML IJ SOLN: 500.0000 mg | Freq: Four times a day (QID) | INTRAVENOUS | Status: DC

## 2014-04-08 MED ORDER — FERROUS SULFATE 325 (65 FE) MG PO TABS
325.0000 mg | ORAL_TABLET | Freq: Three times a day (TID) | ORAL | Status: DC
  Administered 2014-04-08 – 2014-04-11 (×10): 325 mg via ORAL
  Filled 2014-04-08 (×13): qty 1

## 2014-04-08 MED ORDER — WARFARIN SODIUM 5 MG PO TABS: 5.0000 mg | ORAL_TABLET | Freq: Every day | ORAL | Status: AC
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
## 2014-04-08 NOTE — Progress Notes (Signed)
Orthopedic Tech Progress Note Patient Details:  Curtis [name] 02-14-1991 [id number] [facility] called for brace order Patient ID: Curtis [name], male   DOB: 01-Nov-1990, 23 y.o.   MRN: [id number]   [name] 04/08/2014, 9:47 AM

## 2014-04-08 NOTE — Consult Note (Signed)
I have seen and examined the patient. I agree with the findings above.   RLE large knee effusion  LLE reports decreased sensation but intact motor   DP 2+   I discussed with the patient the risks and benefits of surgery for left acetabular fracture, including the possibility of infection, nerve injury, vessel injury, wound breakdown, arthritis, symptomatic hardware, DVT/ PE, avascular necrosis, loss of motion, heterotopic bone, and need for further surgery among others. He understood these risks and wished to proceed.   Budd Palmer, MD  04/04/2014  1:32 PM

## 2014-04-08 NOTE — Plan of Care (Cosign Needed)
Problem: Food- and Nutrition-Related Knowledge Deficit (NB-1.1) Goal: Nutrition education Formal process to instruct or train a patient/client in a skill or to impart knowledge to help patients/clients voluntarily manage or modify food choices and eating behavior to maintain or improve health. Outcome: Completed/Met Date Met:  04/08/14 Dietitian was consulted for a diet education regarding pt's Vitamin D and iron deficiency.  Pt was given a handout "Food Sources of Vitamins and Minerals" from the Academy of Nutrition and Dietetics Manual. Discussed the importance of Vitamin D and iron in the body. Discussed food and beverages that contain Vitamin D, such as fortified milk, fatty fish, and mushrooms . Recommended obtaining Vitamin D also from the sun/being outside. Discussed foods high in iron, such as fortified cereals and red meats. Also recommended taking multivitamins for additional vitamin and minerals.  Teach back method used.  Expect good compliance. Dietitian contact information provided.  Kallie Locks, MS, Provisional LDN Pager # [phone] After hours/ weekend pager # [phone]

## 2014-04-08 NOTE — Progress Notes (Signed)
I have reviewed and discussed in detail with Mr. Curtis [name] the patient's report, examination findings, and I formulated the plan outlined above.  Myrene Galas, MD [facility] [phone] (p)
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
## 2014-04-08 NOTE — Anesthesia Postprocedure Evaluation (Signed)
  Anesthesia Post-op Note  Patient: Curtis [name]  Procedure(s) Performed: Procedure(s): RIGHT KNEE RECONSTRUCTION POSTERIOR LATERAL CORNER (Right) OPEN REDUCTION INTERNAL FIXATION (ORIF) RIGHT INDEX METACARPAL (Right) REPAIR EXTENSOR TENDONS  Patient Location: PACU  Anesthesia Type:General  Level of Consciousness: awake, alert  and oriented  Airway and Oxygen Therapy: Patient Spontanous Breathing and Patient connected to nasal cannula oxygen  Post-op Pain: mild  Post-op Assessment: Post-op Vital signs reviewed, Patient's Cardiovascular Status Stable, Respiratory Function Stable, Patent Airway and No signs of Nausea or vomiting  Post-op Vital Signs: stable  Last Vitals:  Filed Vitals:   04/08/14 0528  BP:   Pulse:   Temp:   Resp: 17    Complications: No apparent anesthesia complications
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
## 2014-04-08 NOTE — Op Note (Signed)
NAMEMarland [name]  JERMINE, BIBBEE NO.:  [id number]  MEDICAL RECORD NO.:  [id number]  LOCATION:  5N20C                        FACILITY:  [facility]  PHYSICIAN:  Artist Pais. Dov Dill, M.D.DATE OF BIRTH:  June 08, 1991  DATE OF PROCEDURE:  04/07/2014 DATE OF DISCHARGE:                              OPERATIVE REPORT   PREOPERATIVE DIAGNOSES:  Open right index finger metacarpophalangeal joint head fracture and extensor tendon laceration x2.  POSTOPERATIVE DIAGNOSES:  Open right index finger metacarpophalangeal joint head fracture and extensor tendon laceration x2.  PROCEDURES:  Incision and drainage of above with open reduction and internal fixation of intra-articular fracture, metacarpal, right index finger with 0.035 K-wires x2; as well as repair of extensor digitorum communis and extensor indicis proprius tendon, index finger, right side.  SURGEON:  Artist Pais. Mina Marble, M.D.  ASSISTANT:  None.  ANESTHESIA:  General.  COMPLICATIONS:  None.  DRAINS:  None.  DESCRIPTION OF PROCEDURE:  The patient was taken to the operating suite. After the induction of adequate general anesthesia, right upper extremity was prepped and draped in usual sterile fashion.  An Esmarch was used to exsanguinate the limb.  Tourniquet was inflated to 250 mmHg. At this point in time, transverse incision had been irrigated and debrided in the emergency room, __________ was opened, extended proximally and distally in __________ fashion.  Flaps were raised. There was complete laceration of the extensor tendon, EDC, EIP, and intra-articular fracture of the metacarpal head with 25% of the head removed.  There was some bony loss from the injury.  We reapproximated as best we could the radial side of the metacarpal head with the collateral ligament insertion and fixed with two 0.035 K-wires.  We then thoroughly washed out the wound and repaired the extensor tendon with 3- 0 FiberWire x2 horizontal mattress  sutures per tendon.  The wound was then irrigated and loosely closed with 4-0 nylon.  The K-wires were capped outside the skin.  The patient was dressed with Xeroform, 4x4s, fluffs, and the volar splint. The patient was then remained in the operating room for definitive knee fixation by Dr. Myrene Galas.  The patient tolerated my procedure well and was again operated on by Dr. Carola Frost.     Artist Pais Mina Marble, M.D.     MAW/MEDQ  D:  04/07/2014  T:  04/07/2014  Job:  [id number]

## 2014-04-08 NOTE — Discharge Instructions (Addendum)
Orthopaedic Trauma Service Discharge Instructions   General Discharge Instructions  WEIGHT BEARING STATUS: Nonweightbearing Bilateral lower extremities, bed to chair transfers only   RANGE OF MOTION/ACTIVITY: Posterior hip precautions left hip.  Unrestricted range of motion Bilateral knees and ankles.- AROM, PROM. Prone exercises as well. No ROM restrictions.  Quad sets, SLR, LAQ, SAQ, heel slides, stretching, prone flexion and extension,  Wound Care: daily dry dressing changes as needed.  See instructions below  Diet: as you were eating previously.  Can use over the counter stool softeners and bowel preparations, such as Miralax, to help with bowel movements.  Narcotics can be constipating.  Be sure to drink plenty of fluids  STOP SMOKING OR USING NICOTINE PRODUCTS!!!!  As discussed nicotine severely impairs your body's ability to heal surgical and traumatic wounds but also impairs bone healing.  Wounds and bone heal by forming microscopic blood vessels (angiogenesis) and nicotine is a vasoconstrictor (essentially, shrinks blood vessels).  Therefore, if vasoconstriction occurs to these microscopic blood vessels they essentially disappear and are unable to deliver necessary nutrients to the healing tissue.  This is one modifiable factor that you can do to dramatically increase your chances of healing your injury.    (This means no smoking, no nicotine gum, patches, etc)  DO NOT USE NONSTEROIDAL ANTI-INFLAMMATORY DRUGS (NSAID'S)  Using products such as Advil (ibuprofen), Aleve (naproxen), Motrin (ibuprofen) for additional pain control during fracture healing can delay and/or prevent the healing response.  If you would like to take over the counter (OTC) medication, Tylenol (acetaminophen) is ok.  However, some narcotic medications that are given for pain control contain acetaminophen as well. Therefore, you should not exceed more than 4000 mg of tylenol in a day if you do not have liver disease.   Also note that there are may OTC medicines, such as cold medicines and allergy medicines that my contain tylenol as well.  If you have any questions about medications and/or interactions please ask your doctor/PA or your pharmacist.   PAIN MEDICATION USE AND EXPECTATIONS  You have likely been given narcotic medications to help control your pain.  After a traumatic event that results in an fracture (broken bone) with or without surgery, it is ok to use narcotic pain medications to help control one's pain.  We understand that everyone responds to pain differently and each individual patient will be evaluated on a regular basis for the continued need for narcotic medications. Ideally, narcotic medication use should last no more than 6-8 weeks (coinciding with fracture healing).   As a patient it is your responsibility as well to monitor narcotic medication use and report the amount and frequency you use these medications when you come to your office visit.   We would also advise that if you are using narcotic medications, you should take a dose prior to therapy to maximize you participation.  IF YOU ARE ON NARCOTIC MEDICATIONS IT IS NOT PERMISSIBLE TO OPERATE A MOTOR VEHICLE (MOTORCYCLE/CAR/TRUCK/MOPED) OR HEAVY MACHINERY DO NOT MIX NARCOTICS WITH OTHER CNS (CENTRAL NERVOUS SYSTEM) DEPRESSANTS SUCH AS ALCOHOL       ICE AND ELEVATE INJURED/OPERATIVE EXTREMITY  Using ice and elevating the injured extremity above your heart can help with swelling and pain control.  Icing in a pulsatile fashion, such as 20 minutes on and 20 minutes off, can be followed.    Do not place ice directly on skin. Make sure there is a barrier between to skin and the ice pack.    Using  frozen items such as frozen peas works well as the conform nicely to the are that needs to be iced.  USE AN ACE WRAP OR TED HOSE FOR SWELLING CONTROL  In addition to icing and elevation, Ace wraps or TED hose are used to help limit and resolve  swelling.  It is recommended to use Ace wraps or TED hose until you are informed to stop.    When using Ace Wraps start the wrapping distally (farthest away from the body) and wrap proximally (closer to the body)   Example: If you had surgery on your leg or thing and you do not have a splint on, start the ace wrap at the toes and work your way up to the thigh        If you had surgery on your upper extremity and do not have a splint on, start the ace wrap at your fingers and work your way up to the upper arm  IF YOU ARE IN A SPLINT OR CAST DO NOT REMOVE IT FOR ANY REASON   If your splint gets wet for any reason please contact the office immediately. You may shower in your splint or cast as long as you keep it dry.  This can be done by wrapping in a cast cover or garbage back (or similar)  Do Not stick any thing down your splint or cast such as pencils, money, or hangers to try and scratch yourself with.  If you feel itchy take benadryl as prescribed on the bottle for itching  IF YOU ARE IN A CAM BOOT (BLACK BOOT)  You may remove boot periodically. Perform daily dressing changes as noted below.  Wash the liner of the boot regularly and wear a sock when wearing the boot. It is recommended that you sleep in the boot until told otherwise  CALL THE OFFICE WITH ANY QUESTIONS OR CONCERTS: [phone]   Discharge Wound Care Instructions  Do NOT apply any ointments, solutions or lotions to pin sites or surgical wounds.  These prevent needed drainage and even though solutions like hydrogen peroxide kill bacteria, they also damage cells lining the pin sites that help fight infection.  Applying lotions or ointments can keep the wounds moist and can cause them to breakdown and open up as well. This can increase the risk for infection. When in doubt call the office.  Surgical incisions should be dressed daily.  If any drainage is noted, use one layer of adaptic, then gauze, Kerlix, and an ace wrap.  Once  the incision is completely dry and without drainage, it may be left open to air out.  Showering may begin 36-48 hours later.  Cleaning gently with soap and water.  Traumatic wounds should be dressed daily as well.    One layer of adaptic, gauze, Kerlix, then ace wrap.  The adaptic can be discontinued once the draining has ceased    If you have a wet to dry dressing: wet the gauze with saline the squeeze as much saline out so the gauze is moist (not soaking wet), place moistened gauze over wound, then place a dry gauze over the moist one, followed by Kerlix wrap, then ace wrap.   Orthopaedic injury specific instructions   L posterior wall acetabulum fracture dislocation s/p ORIF       TDWB x 8 weeks but given B injuries will be bed to chair transfers with slide or lift       Posterior hip precautions x 12 weeks  Ice prn       Dressing changes PRN       PT/OT   open L knee impaction fx of patella, medial femoral condyle and partial MPFL tear, partial proximal patellar tendon tear, partial tear of distal vastus medialis and lateralis muscles         ROM as tolerated PROM and AROM       Bed to chair transfers x 8 weeks       Ice prn       Ok to dc knee immobilizer for activities and at rest   R knee internal derangement: PCL tear, PLC disruption, medial and lateral patellofemoral ligament tears, posterior knee capsule rupture, muscular injury s/p PLC repair       NWB x 8 weeks       Hinged brace       AROM and PROM, supine and prone exercises. No formal ROM restrictions    Open R index MC fx and tendon injury s/p repair       Per Dr. Mina Marble- contact his office with questions regarding your hand

## 2014-04-09 LAB — PROTIME-INR
INR: 3.01 — AB (ref 0.00–1.49)
PROTHROMBIN TIME: 31.2 s — AB (ref 11.6–15.2)

## 2014-04-09 LAB — BASIC METABOLIC PANEL
ANION GAP: 10 (ref 5–15)
BUN: 6 mg/dL (ref 6–23)
CO2: 31 mEq/L (ref 19–32)
Calcium: 9.3 mg/dL (ref 8.4–10.5)
Chloride: 97 mEq/L (ref 96–112)
Creatinine, Ser: 0.76 mg/dL (ref 0.50–1.35)
Glucose, Bld: 117 mg/dL — ABNORMAL HIGH (ref 70–99)
POTASSIUM: 4.1 meq/L (ref 3.7–5.3)
SODIUM: 138 meq/L (ref 137–147)
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
## 2014-04-09 NOTE — Progress Notes (Signed)
SPORTS MEDICINE AND JOINT REPLACEMENT  Georgena Spurling, MD   Altamese Cabal, PA-C [address] [place], [place], [place]  [address]                             [phone]   PROGRESS NOTE  Subjective:  negative for Chest Pain  negative for Shortness of Breath  negative for Nausea/Vomiting   negative for Calf Pain  negative for Bowel Movement   Tolerating Diet: yes         Patient reports pain as 6 on 0-10 scale.    Objective: Vital signs in last 24 hours:   Patient Vitals for the past 24 hrs:  BP Temp Temp src Pulse Resp SpO2  04/09/14 0806 - - - - 16 100 %  04/09/14 0520 142/75 mmHg 97.9 F (36.6 C) - 82 18 100 %  04/08/14 1946 136/66 mmHg 98.5 F (36.9 C) - 88 18 98 %  04/08/14 1500 138/64 mmHg 98.3 F (36.8 C) Oral - 18 98 %    {1959:LAST@   Intake/Output from previous day:   09/18 0701 - 09/19 0700 In: 2420 [P.O.:1320; I.V.:1100] Out: 950 [Urine:950]   Intake/Output this shift:       Intake/Output     09/18 0701 - 09/19 0700 09/19 0701 - 09/20 0700   P.O. 1320    I.V. (mL/kg) 1100 (14.3)    Total Intake(mL/kg) 2420 (31.4)    Urine (mL/kg/hr) 950 (0.5)    Blood     Total Output 950     Net +1470          Urine Occurrence 3 x       LABORATORY DATA:  Recent Labs  04/03/14 0330 04/03/14 0357 04/03/14 1910 04/04/14 0703 04/05/14 0900 04/06/14 0535 04/07/14 0548 04/08/14 0527  WBC 15.4*  --  12.9* 13.7* 14.8* 12.7* 9.7 11.3*  HGB 13.6 15.0 11.5* 12.8* 11.1* 9.9* 9.6* 9.0*  HCT 39.1 44.0 33.2* 37.7* 32.4* 29.9* 28.0* 26.6*  PLT 249  --  246 202 198 200 224 280    Recent Labs  04/03/14 0330 04/03/14 0357 04/03/14 1910 04/04/14 0703 04/05/14 0507 04/06/14 0535 04/08/14 0527 04/09/14 0555  NA 142 142 139 138 136* 137 138 138  K 3.7 3.4* 4.0 4.3 3.8 3.6* 4.0 4.1  CL 105 111 101 101 99 99 97 97  CO2 17*  --  32 32 31  BUN 3* 3* 5* 6  CREATININE 1.13 1.40* 0.85 0.88 0.82 0.79 0.72 0.76  GLUCOSE 160* 162* 97 96 136*  113* 108* 117*  CALCIUM 9.5  --  9.2 9.4 8.8 8.8  8.3* 8.9 9.3   Lab Results  Component Value Date   INR 3.01* 04/09/2014   INR 2.98* 04/08/2014   INR 1.87* 04/07/2014    Examination:  General appearance: alert, cooperative and no distress Extremities: extremities normal, atraumatic, no cyanosis or edema and Homans sign is negative, no sign of DVT  Wound Exam: clean, dry, intact   Drainage:  None: wound tissue dry  Motor Exam: EHL and FHL Intact  Sensory Exam: Deep Peroneal normal   Assessment:    2 Days Post-Op  Procedure(s) (LRB): RIGHT KNEE RECONSTRUCTION POSTERIOR LATERAL CORNER (Right) OPEN REDUCTION INTERNAL FIXATION (ORIF) RIGHT INDEX METACARPAL (Right) REPAIR EXTENSOR TENDONS  ADDITIONAL DIAGNOSIS:  Active Problems:   Left acetabular fracture   Vitamin D deficiency   Testosterone deficiency  Low iron   Derangement of right knee ligament, Posterior lateral corner, PCL, Medial and lateral patellofemoral ligament    Derangement of left knee ligament, partial MPFL tear    Open right hand fracture  Acute Blood Loss Anemia   Plan: Physical Therapy as ordered Non Weight Bearing (NWB)  DVT Prophylaxis:  Lovenox  DISCHARGE PLAN: Home  Dressing changed today on left and right lower extremities.  Also ace wrap changed but not splint on right upper extremity.         Dorothia Passmore 04/09/2014, 10:21 AM

## 2014-04-10 LAB — CBC
HEMATOCRIT: 28.7 % — AB (ref 39.0–52.0)
Hemoglobin: 9.7 g/dL — ABNORMAL LOW (ref 13.0–17.0)
MCH: 30.1 pg (ref 26.0–34.0)
MCHC: 33.8 g/dL (ref 30.0–36.0)
MCV: 89.1 fL (ref 78.0–100.0)
Platelets: 403 10*3/uL — ABNORMAL HIGH (ref 150–400)
RBC: 3.22 MIL/uL — ABNORMAL LOW (ref 4.22–5.81)
RDW: 12.7 % (ref 11.5–15.5)
WBC: 10.9 10*3/uL — AB (ref 4.0–10.5)

## 2014-04-10 LAB — BASIC METABOLIC PANEL
Anion gap: 11 (ref 5–15)
BUN: 7 mg/dL (ref 6–23)
CALCIUM: 9.2 mg/dL (ref 8.4–10.5)
CHLORIDE: 95 meq/L — AB (ref 96–112)
CO2: 30 mEq/L (ref 19–32)
CREATININE: 0.75 mg/dL (ref 0.50–1.35)
Glucose, Bld: 101 mg/dL — ABNORMAL HIGH (ref 70–99)
Potassium: 4.2 mEq/L (ref 3.7–5.3)
Sodium: 136 mEq/L — ABNORMAL LOW (ref 137–147)

## 2014-04-10 LAB — PROTIME-INR
INR: 2.01 — ABNORMAL HIGH (ref 0.00–1.49)
Prothrombin Time: 22.8 seconds — ABNORMAL HIGH (ref 11.6–15.2)

## 2014-04-10 MED ORDER — WARFARIN SODIUM 7.5 MG PO TABS
7.5000 mg | ORAL_TABLET | Freq: Once | ORAL | Status: AC
  Administered 2014-04-10: 7.5 mg via ORAL
  Filled 2014-04-10: qty 1

## 2014-04-10 MED ORDER — OXYCODONE HCL ER 10 MG PO T12A
10.0000 mg | EXTENDED_RELEASE_TABLET | Freq: Two times a day (BID) | ORAL | Status: DC
  Administered 2014-04-10 – 2014-04-11 (×3): 10 mg via ORAL
  Filled 2014-04-10 (×4): qty 1
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
## 2014-04-10 NOTE — Progress Notes (Signed)
Patient is requesting Neosporin for abrasions on arms/hands/legs.
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
## 2014-04-10 NOTE — Progress Notes (Signed)
SPORTS MEDICINE AND JOINT REPLACEMENT  Georgena Spurling, MD   Altamese Cabal, PA-C [address] [place], [place], [place]  [address]                             [phone]   PROGRESS NOTE  Subjective:  negative for Chest Pain  negative for Shortness of Breath  negative for Nausea/Vomiting   negative for Calf Pain  negative for Bowel Movement   Tolerating Diet: yes         Patient reports pain as 7 on 0-10 scale.    Objective: Vital signs in last 24 hours:   Patient Vitals for the past 24 hrs:  BP Temp Temp src Pulse Resp SpO2  04/10/14 0742 - - - - 16 96 %  04/10/14 0658 116/66 mmHg 98.8 F (37.1 C) Oral 101 16 94 %  04/09/14 2240 138/73 mmHg 98.9 F (37.2 C) Oral 100 16 99 %  04/09/14 1551 - - - - 18 99 %  04/09/14 1426 151/66 mmHg 98.8 F (37.1 C) Oral 93 18 99 %  04/09/14 1153 - - - - 18 100 %    {1959:LAST@   Intake/Output from previous day:   09/19 0701 - 09/20 0700 In: 600 [I.V.:600] Out: -    Intake/Output this shift:       Intake/Output     09/19 0701 - 09/20 0700 09/20 0701 - 09/21 0700   P.O.     I.V. (mL/kg) 600 (7.8)    Total Intake(mL/kg) 600 (7.8)    Urine (mL/kg/hr)     Total Output       Net +600             LABORATORY DATA:  Recent Labs  04/03/14 1910 04/04/14 0703 04/05/14 0900 04/06/14 0535 04/07/14 0548 04/08/14 0527 04/10/14 0507  WBC 12.9* 13.7* 14.8* 12.7* 9.7 11.3* 10.9*  HGB 11.5* 12.8* 11.1* 9.9* 9.6* 9.0* 9.7*  HCT 33.2* 37.7* 32.4* 29.9* 28.0* 26.6* 28.7*  PLT 246 202 198 200 224 280 403*    Recent Labs  04/03/14 1910 04/04/14 0703 04/05/14 0507 04/06/14 0535 04/08/14 0527 04/09/14 0555 04/10/14 0507  NA 139 138 136* 137 138 138 136*  K 4.0 4.3 3.8 3.6* 4.0 4.1 4.2  CL 101 101 99 99 97 97 95*  CO2 32 32 31 30  BUN 9 7 3* 3* 5* 6 7  CREATININE 0.85 0.88 0.82 0.79 0.72 0.76 0.75  GLUCOSE 97 96 136* 113* 108* 117* 101*  CALCIUM 9.2 9.4 8.8 8.8  8.3* 8.9 9.3 9.2   Lab Results  Component Value  Date   INR 2.01* 04/10/2014   INR 3.01* 04/09/2014   INR 2.98* 04/08/2014    Examination:  General appearance: alert, cooperative and no distress Extremities: extremities normal, atraumatic, no cyanosis or edema and Homans sign is negative, no sign of DVT  Wound Exam: clean, dry, intact   Drainage:  None: wound tissue dry  Motor Exam: EHL and FHL Intact  Sensory Exam: Deep Peroneal normal   Assessment:    3 Days Post-Op  Procedure(s) (LRB): RIGHT KNEE RECONSTRUCTION POSTERIOR LATERAL CORNER (Right) OPEN REDUCTION INTERNAL FIXATION (ORIF) RIGHT INDEX METACARPAL (Right) REPAIR EXTENSOR TENDONS  ADDITIONAL DIAGNOSIS:  Active Problems:   Left acetabular fracture   Vitamin D deficiency   Testosterone deficiency   Low iron   Derangement of right knee ligament, Posterior lateral corner, PCL, Medial and  lateral patellofemoral ligament    Derangement of left knee ligament, partial MPFL tear    Open right hand fracture  Acute Blood Loss Anemia   Plan: Physical Therapy as ordered Non Weight Bearing (NWB)  DVT Prophylaxis:  Coumadin  DISCHARGE PLAN: Home           Curtis [name] 04/10/2014, 10:03 AM

## 2014-04-11 ENCOUNTER — Encounter (HOSPITAL_COMMUNITY): Payer: Self-pay | Admitting: Orthopedic Surgery

## 2014-04-11 LAB — CREATININE, SERUM
Creatinine, Ser: 0.74 mg/dL (ref 0.50–1.35)
GFR calc non Af Amer: >90 mL/min (ref >90)

## 2014-04-11 LAB — DRUG SCREEN PANEL (SERUM)

## 2014-04-11 LAB — PROTIME-INR
INR: 1.47 (ref 0.00–1.49)
Prothrombin Time: 17.8 seconds — ABNORMAL HIGH (ref 11.6–15.2)

## 2014-04-11 MED ORDER — WARFARIN SODIUM 7.5 MG PO TABS
7.5000 mg | ORAL_TABLET | Freq: Once | ORAL | Status: AC
  Administered 2014-04-11: 7.5 mg via ORAL
  Filled 2014-04-11: qty 1

## 2014-04-11 NOTE — Progress Notes (Signed)
Patient discharged to home accompanied by family. Discharge instructions, follow-up information, and rx given and explained and patient/family stated understanding. IV was removed and patient left unit in a stable condition with all personal belongings via wheelchair.

## 2014-04-11 NOTE — Progress Notes (Signed)
OT Cancellation Note  Patient Details Name: Curtis [name] MRN: [id number] DOB: 24-Jul-1990   Cancelled Treatment:    Reason Eval/Treat Not Completed: Pain limiting ability to participate;Fatigue/lethargy limiting ability to participate  Earlie Raveling OTR/L [phone] 04/11/2014, 1:31 PM

## 2014-04-11 NOTE — Care Management Note (Signed)
0/920/15 10:26am [name], RN BSN Case manager CM spoke with patient and mom concerning discharge plan and home health agency. Patient will go home with his mom- Curtis [name]- [address] [place], [place]  [phone]. CM called Jodene Nam, [facility] Liaison and informed her where patient will be recovering. Emotional support given.

## 2014-04-11 NOTE — Progress Notes (Signed)
ANTICOAGULATION CONSULT NOTE - Follow Up Consult  Pharmacy Consult for coumadin Indication: VTE prophylaxis  No Known Allergies  Patient Measurements: Height:  (185.4 cm) Weight: 170 lb (77.111 kg) IBW/kg (Calculated) : 79.9   Vital Signs: Temp src: Oral (09/21 0439) BP: 136/79 mmHg (09/21 0439) Pulse Rate: 91 (09/21 0439)  Labs:  Recent Labs  04/09/14 0555 04/10/14 0507 04/11/14 0650  HGB  --  9.7*  --   HCT  --  28.7*  --   PLT  --  403*  --   LABPROT 31.2* 22.8* 17.8*  INR 3.01* 2.01* 1.47  CREATININE 0.76 0.75 0.74    Estimated Creatinine Clearance: 155.3 ml/min (by C-G formula based on Cr of 0.74).  Assessment: Patient is a 23 y.o M on coumadin for VTE prophylaxis. INR down to 1.47 today with doses held on 9/18 and 9/19.  No bleeding documented.  Goal of Therapy:  INR 2-3    Plan:  1) coumadin 7.5mg  PO x1 today  Curtis [name] 04/11/2014,1:48 PM
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
## 2014-04-12 ENCOUNTER — Encounter (HOSPITAL_COMMUNITY): Payer: Self-pay | Admitting: Orthopedic Surgery

## 2014-04-25 ENCOUNTER — Encounter (HOSPITAL_COMMUNITY): Payer: Self-pay | Admitting: Orthopedic Surgery

## 2014-04-28 ENCOUNTER — Encounter (HOSPITAL_COMMUNITY): Payer: Self-pay | Admitting: Orthopedic Surgery

## 2014-04-28 DIAGNOSIS — S82002B Unspecified fracture of left patella, initial encounter for open fracture type I or II: Secondary | ICD-10-CM | POA: Diagnosis present

## 2014-04-28 DIAGNOSIS — S73015A Posterior dislocation of left hip, initial encounter: Secondary | ICD-10-CM

## 2014-04-28 DIAGNOSIS — F1910 Other psychoactive substance abuse, uncomplicated: Secondary | ICD-10-CM

## 2014-04-28 DIAGNOSIS — S0219XA Other fracture of base of skull, initial encounter for closed fracture: Secondary | ICD-10-CM | POA: Diagnosis present

## 2014-04-28 DIAGNOSIS — F1010 Alcohol abuse, uncomplicated: Secondary | ICD-10-CM | POA: Diagnosis present

## 2014-04-28 DIAGNOSIS — E291 Testicular hypofunction: Secondary | ICD-10-CM | POA: Diagnosis present

## 2014-04-28 HISTORY — DX: Posterior dislocation of left hip, initial encounter: S73.015A

## 2014-04-28 HISTORY — DX: Other psychoactive substance abuse, uncomplicated: F19.10

## 2014-04-28 HISTORY — DX: Unspecified fracture of left patella, initial encounter for open fracture type I or II: S82.002B
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
## 2015-03-10 IMAGING — CR DG HAND COMPLETE 3+V*R*
3 series · 3 of 3 positions shown · non-contrast
Comparison: 04/03/2014

CLINICAL DATA: Postop evaluation

EXAM:
RIGHT HAND - COMPLETE 3+ VIEW

[PA]
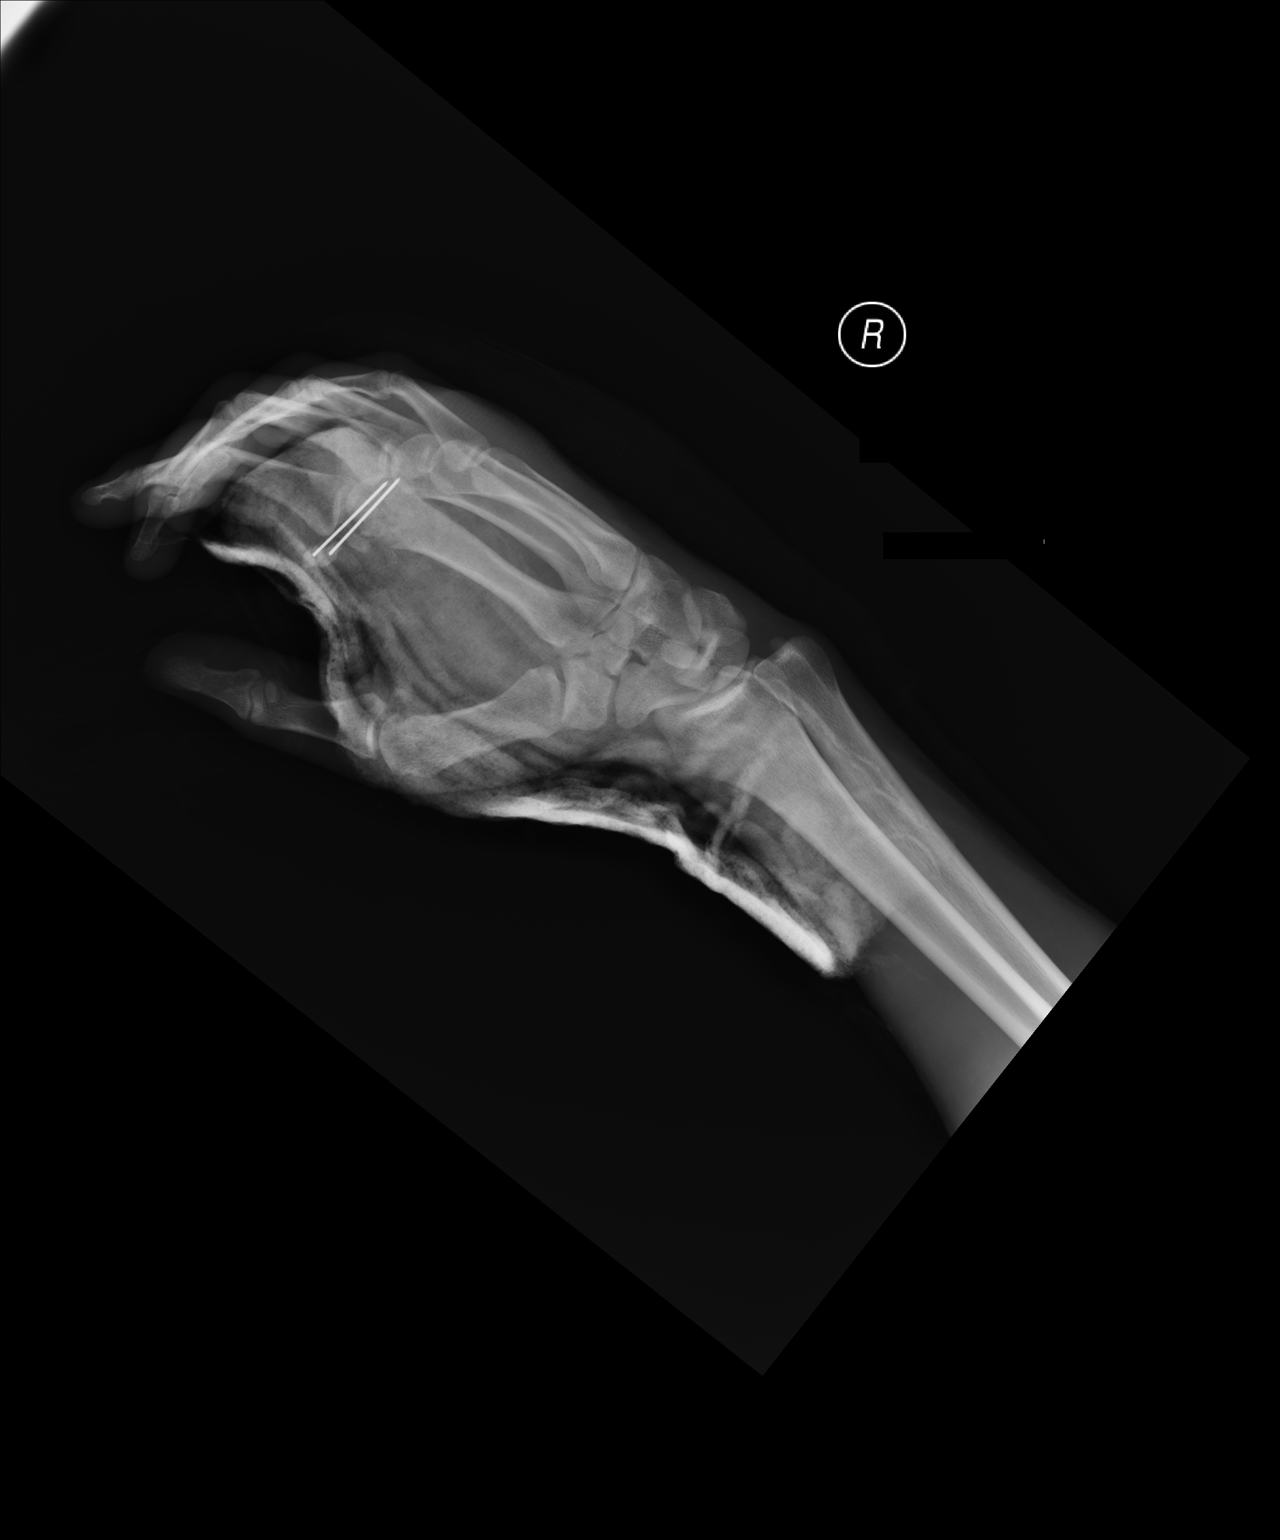

[lateral]
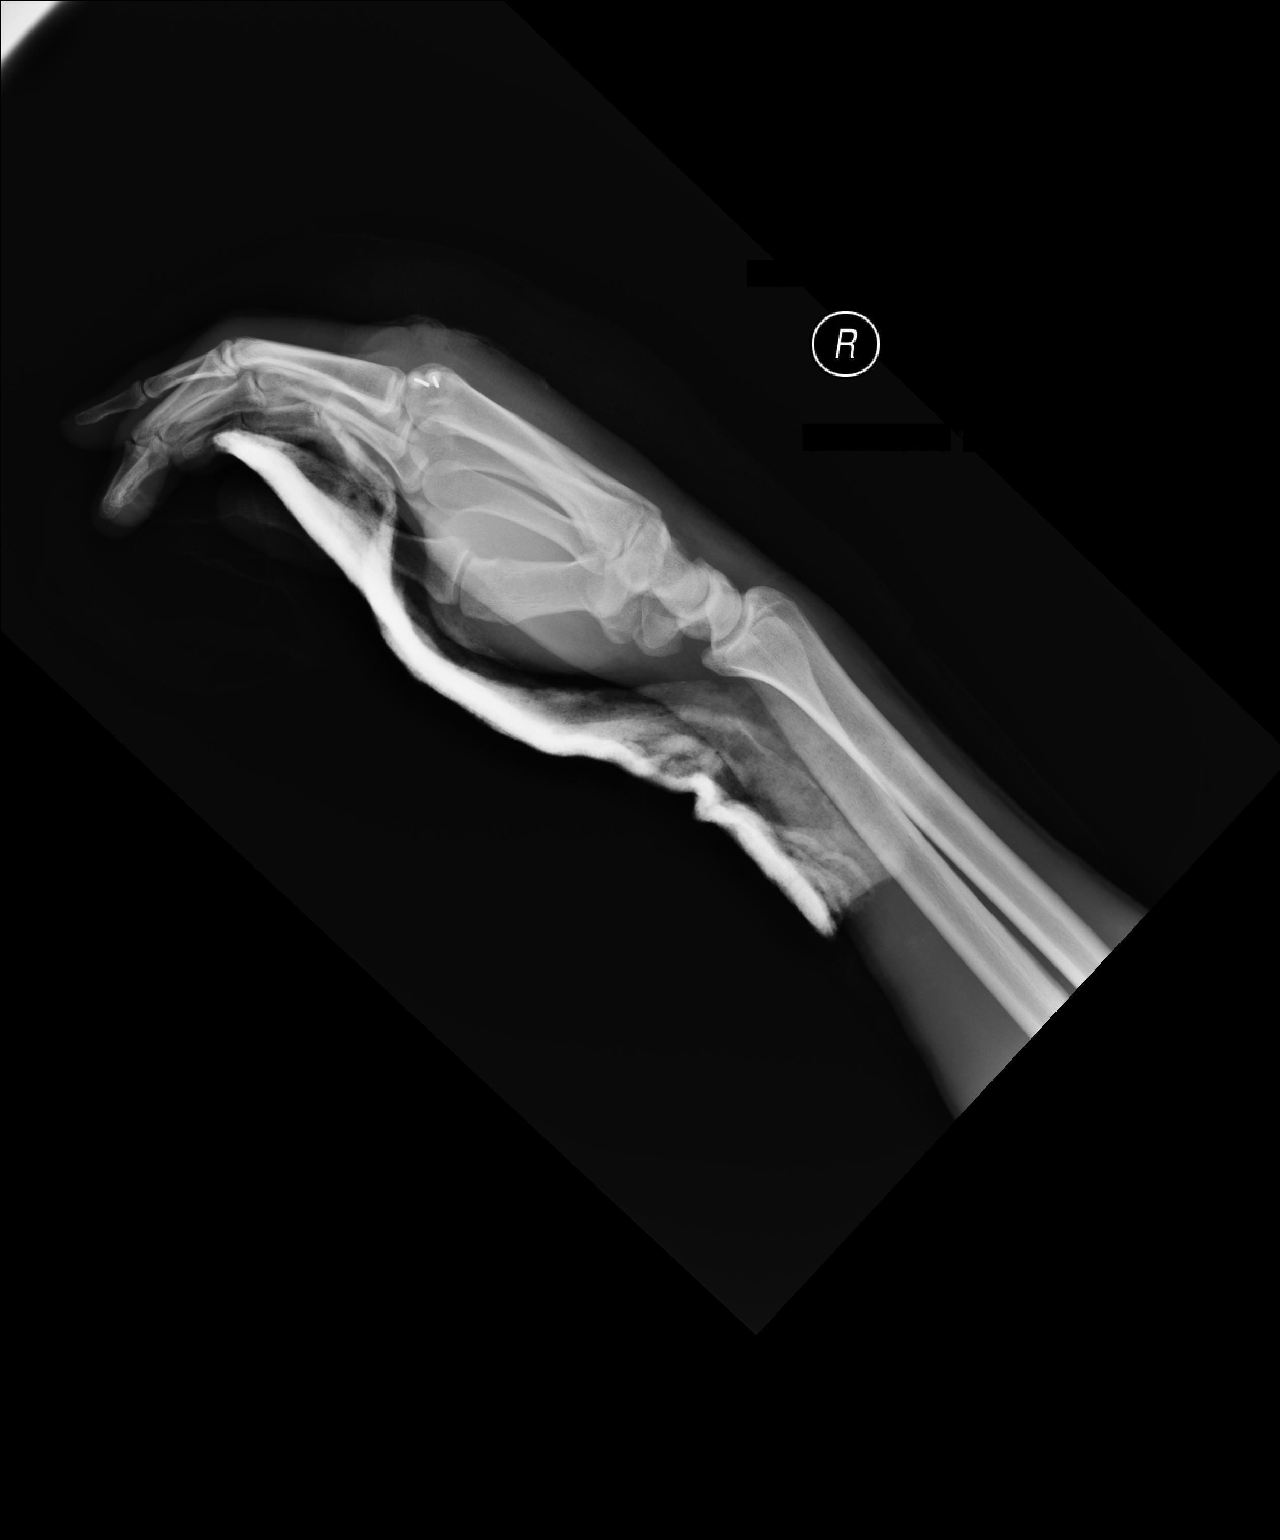

[pa obl]
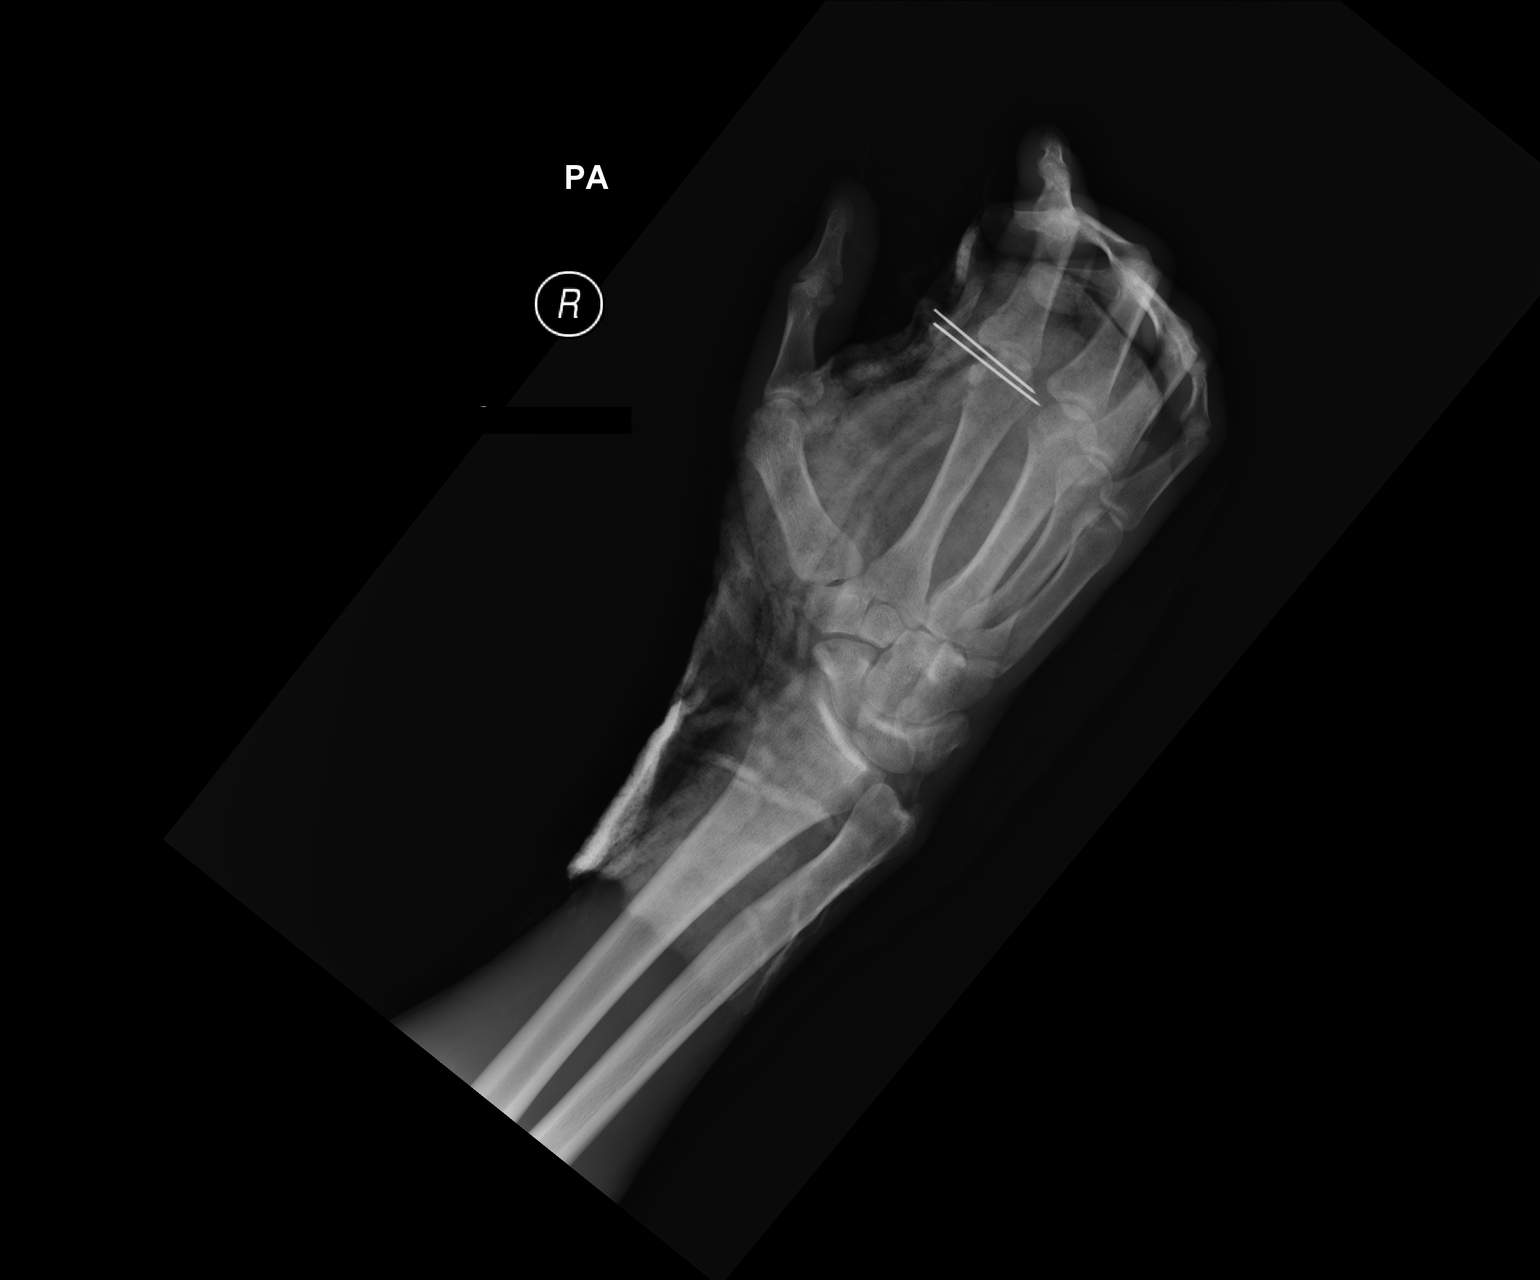

[3 of 3 positions shown; findings below may reference images not displayed]

FINDINGS: Cast material is demonstrated overlying the right and. Postoperative
changes compatible with fixation of distal second metacarpal
fracture. Fracture fragments appear in improved alignment. No
evidence for associated acute osseous abnormality.
IMPRESSION: Improved alignment of fracture fragments involving the distal second
metacarpal.
